# Patient Record
Sex: Female | Born: 1955
Health system: Southern US, Community
[De-identification: ages and names within clinical notes are randomized; demographics above are authoritative.]

## PROBLEM LIST (undated history)

## (undated) DIAGNOSIS — F329 Major depressive disorder, single episode, unspecified: Secondary | ICD-10-CM

## (undated) DIAGNOSIS — I2510 Atherosclerotic heart disease of native coronary artery without angina pectoris: Secondary | ICD-10-CM

## (undated) DIAGNOSIS — Z8249 Family history of ischemic heart disease and other diseases of the circulatory system: Secondary | ICD-10-CM

## (undated) DIAGNOSIS — F32A Depression, unspecified: Secondary | ICD-10-CM

## (undated) HISTORY — DX: Family history of ischemic heart disease and other diseases of the circulatory system: Z82.49

## (undated) HISTORY — PX: CHOLECYSTECTOMY: SHX55

## (undated) HISTORY — PX: MOUTH SURGERY: SHX715

## (undated) HISTORY — DX: Atherosclerotic heart disease of native coronary artery without angina pectoris: I25.10

---

## 2001-02-21 ENCOUNTER — Emergency Department (HOSPITAL_COMMUNITY): Admission: EM | Admit: 2001-02-21 | Discharge: 2001-02-22 | Payer: Self-pay | Admitting: Emergency Medicine

## 2001-02-21 ENCOUNTER — Encounter: Payer: Self-pay | Admitting: Emergency Medicine

## 2001-04-13 ENCOUNTER — Encounter (INDEPENDENT_AMBULATORY_CARE_PROVIDER_SITE_OTHER): Payer: Self-pay | Admitting: Specialist

## 2001-04-13 ENCOUNTER — Observation Stay (HOSPITAL_COMMUNITY): Admission: RE | Admit: 2001-04-13 | Discharge: 2001-04-14 | Payer: Self-pay | Admitting: General Surgery

## 2001-04-13 ENCOUNTER — Encounter: Payer: Self-pay | Admitting: General Surgery

## 2005-01-03 ENCOUNTER — Ambulatory Visit: Payer: Self-pay | Admitting: Internal Medicine

## 2008-03-16 ENCOUNTER — Emergency Department (HOSPITAL_COMMUNITY): Admission: EM | Admit: 2008-03-16 | Discharge: 2008-03-16 | Payer: Self-pay | Admitting: Family Medicine

## 2009-07-17 ENCOUNTER — Emergency Department (HOSPITAL_BASED_OUTPATIENT_CLINIC_OR_DEPARTMENT_OTHER): Admission: EM | Admit: 2009-07-17 | Discharge: 2009-07-17 | Payer: Self-pay | Admitting: Emergency Medicine

## 2009-07-21 ENCOUNTER — Ambulatory Visit: Payer: Self-pay | Admitting: Diagnostic Radiology

## 2009-07-21 ENCOUNTER — Emergency Department (HOSPITAL_BASED_OUTPATIENT_CLINIC_OR_DEPARTMENT_OTHER): Admission: EM | Admit: 2009-07-21 | Discharge: 2009-07-22 | Payer: Self-pay | Admitting: Emergency Medicine

## 2009-07-22 ENCOUNTER — Ambulatory Visit: Payer: Self-pay | Admitting: Interventional Radiology

## 2009-08-04 ENCOUNTER — Ambulatory Visit: Payer: Self-pay

## 2009-08-04 ENCOUNTER — Ambulatory Visit: Payer: Self-pay | Admitting: Cardiology

## 2009-08-04 ENCOUNTER — Encounter (INDEPENDENT_AMBULATORY_CARE_PROVIDER_SITE_OTHER): Payer: Self-pay | Admitting: Neurology

## 2009-08-04 ENCOUNTER — Ambulatory Visit (HOSPITAL_COMMUNITY): Admission: RE | Admit: 2009-08-04 | Discharge: 2009-08-04 | Payer: Self-pay | Admitting: Neurology

## 2011-01-06 LAB — COMPREHENSIVE METABOLIC PANEL
ALT: 17 U/L (ref 0–35)
AST: 21 U/L (ref 0–37)
Alkaline Phosphatase: 68 U/L (ref 39–117)
CO2: 26 mEq/L (ref 19–32)
Chloride: 106 mEq/L (ref 96–112)
Creatinine, Ser: 0.7 mg/dL (ref 0.4–1.2)
GFR calc Af Amer: >60 mL/min (ref >60)
GFR calc non Af Amer: >60 mL/min (ref >60)
Total Bilirubin: 0.5 mg/dL (ref 0.3–1.2)

## 2011-01-06 LAB — CBC
HCT: 45.6 % (ref 36.0–46.0)
Hemoglobin: 15.5 g/dL — ABNORMAL HIGH (ref 12.0–15.0)
MCHC: 34 g/dL (ref 30.0–36.0)
MCV: 91.3 fL (ref 78.0–100.0)
Platelets: 282 10*3/uL (ref 150–400)
RBC: 4.99 MIL/uL (ref 3.87–5.11)
RDW: 12.1 % (ref 11.5–15.5)
WBC: 10.1 10*3/uL (ref 4.0–10.5)

## 2011-01-06 LAB — URINALYSIS, ROUTINE W REFLEX MICROSCOPIC
Ketones, ur: NEGATIVE mg/dL
Nitrite: NEGATIVE
Protein, ur: NEGATIVE mg/dL
Urobilinogen, UA: 0.2 mg/dL (ref 0.0–1.0)

## 2011-01-06 LAB — DIFFERENTIAL
Basophils Absolute: 0.1 10*3/uL (ref 0.0–0.1)
Basophils Relative: 1 % (ref 0–1)
Eosinophils Absolute: 0 10*3/uL (ref 0.0–0.7)
Eosinophils Relative: 0 % (ref 0–5)
Lymphocytes Relative: 15 % (ref 12–46)

## 2011-02-18 NOTE — Op Note (Signed)
[facility]  Patient:    Connie Shaw, Connie Shaw                         MRN: [id number] Proc. Date: 04/13/01 Adm. Date:  [id number] Attending:  Henrene Dodge                           Operative Report  PREOPERATIVE DIAGNOSIS:  Chronic cholecystitis.  POSTOPERATIVE DIAGNOSIS;  Chronic cholecystitis.  OPERATION:  Laparoscopic cholecystectomy with cholangiogram.  ANESTHESIA:  General.  SURGEON:  Anselm Pancoast. Zachery Dakins, M.D.  ASSISTANTRiley Lam A. Magnus Ivan, M.D.  HISTORY:  Connie Shaw is a 55 year old Caucasian female, who was referred to me for symptomatic gallstones.  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  She also was noted to have gallstones and was referred to me.  She has stopped smoking over the last several months because of this pulmonary situation and is doing satisfactory from that standpoint.  I recommended that we proceed on with a laparoscopic cholecystectomy with cholangiogram.  Her liver function studies were normal preoperatively.  DESCRIPTION OF PROCEDURE:  The patient was taken to the operative suite.  She was given 3 grams of Unasyn, PAS stockings in place.  Induction of general anesthesia via endotracheal tube.  The abdomen was prepped with Betadine surgical scrub and solution and draped in a sterile manner.  A small incision was made below the umbilicus.  The patient noted to have a little fascial defect at the umbilicus but not really a truly symptomatic hernia with only a preperitoneal fat coming up through this, and this was removed, and then the peritoneum underlying it was identified, picked up between two hemostats, and a small opening made into the peritoneal cavity.  The traction suture was placed and the Hasson cannula introduced.  The gallbladder was distended but not acutely  inflamed.  The upper 10 mm trocar was placed under direct vision after anesthetizing the fascia with Marcaine, and the two lateral 5 mm trocars were placed in the appropriate position by Dr. Magnus Ivan.  The gallbladder was retracted upward and outward, and it was noted that her common bile duct appeared to be slightly prominent with a very short cystic duct.  We dissected this out as well as the cystic artery and placed a clip on the gallbladder junction of the cystic duct and then made a little opening.  Her cystic duct is not tiny but is not real large either.  The catheter was easily slipped within it, and then a cholangiogram was obtained.  It was noted that her common bile duct is definitely slightly prominent, but we did not see any definite stone at the distal ampulla area, and the dye reflects up into the intrahepatic radicles nicely.  I then removed the catheters, triply clipped the cystic duct under direct vision, divided it, then clipped the cystic artery x 3, divided it between the distal clip and two clips, and then freed the gallbladder from its bed with the hook electrocautery.  Good hemostasis was obtained, and then we grasped the gallbladder and brought it up at the umbilicus.  The neck of the gallbladder was open, and we fished out numerous cholesterol stones before it was slipped through the fascia.  Some of the stones  were up to 1 cm in size, but many were quite small, and I would not be surprised if she has not had a common bile duct stone given this chest discomfort that was being evaluated when the congenital bleb of the wound was identified.  The patient tolerated the procedure nicely and was extubated and taken to the recovery room in a stable postoperative condition.  She should be ready for discharge in the a.m., and the fascia closed at the umbilicus with two figure-of-eights of 0 Vicryl and then the subcuticular wounds closed with 4-0 Vicryl and Benzoin and  Steri-Strips on the skin, and the patient was extubated and taken to the recovery room in a satisfactory postoperative condition. DD:  04/13/01 TD:  04/13/01 Job: [id number]

## 2012-07-07 ENCOUNTER — Ambulatory Visit (INDEPENDENT_AMBULATORY_CARE_PROVIDER_SITE_OTHER): Payer: 59 | Admitting: Family Medicine

## 2012-07-07 VITALS — BP 126/78 | HR 75 | Temp 98.6°F | Resp 16 | Ht 65.5 in | Wt 174.2 lb

## 2012-07-07 DIAGNOSIS — J209 Acute bronchitis, unspecified: Secondary | ICD-10-CM

## 2012-07-07 DIAGNOSIS — J40 Bronchitis, not specified as acute or chronic: Secondary | ICD-10-CM

## 2012-07-07 MED ORDER — ALBUTEROL SULFATE HFA 108 (90 BASE) MCG/ACT IN AERS: 2.0000 | INHALATION_SPRAY | Freq: Four times a day (QID) | RESPIRATORY_TRACT | Status: DC | PRN

## 2012-07-07 MED ORDER — AZITHROMYCIN 250 MG PO TABS: ORAL_TABLET | ORAL | Status: DC

## 2012-07-07 MED ORDER — PREDNISONE 20 MG PO TABS: ORAL_TABLET | ORAL | Status: DC

## 2012-07-07 NOTE — Progress Notes (Signed)
@  [email or web address]@   Patient ID: Connie Shaw MRN: [id number], DOB: [birth year], 56 y.o. Date of Encounter: 07/07/2012, 2:20 PM  Primary Physician: No primary provider on file.  Chief Complaint:  Chief Complaint  Patient presents with  . Cough    x 2 months non productive   . Nasal Congestion    x  2 months chest congestion   . Shortness of Breath    x 1 week     HPI: 56 y.o. year old female presents with a 9 day history of nasal congestion, post nasal drip, sore throat, and cough. Mild sinus pressure. Afebrile. No chills. Nasal congestion thick and green/yellow. Cough is productive of green/yellow sputum and not associated with time of day. Ears feel full, leading to sensation of muffled hearing. Has tried OTC cold preps without success. No GI complaints.  No sick contacts, recent antibiotics, or recent travels.   No leg trauma, sedentary periods, h/o cancer, or tobacco use.  No past medical history on file.   Home Meds: Prior to Admission medications   Not on File    Allergies:  Allergies  Allergen Reactions  . Codeine Itching    History   Social History  . Marital Status: Married    Spouse Name: N/A    Number of Children: N/A  . Years of Education: N/A   Occupational History  . Not on file.   Social History Main Topics  . Smoking status: Current Every Day Smoker  . Smokeless tobacco: Not on file  . Alcohol Use: Not on file  . Drug Use: Not on file  . Sexually Active: Not on file   Other Topics Concern  . Not on file   Social History Narrative  . No narrative on file     Review of Systems: Constitutional: negative for chills, fever, night sweats or weight changes Cardiovascular: negative for chest pain or palpitations Respiratory: negative for hemoptysis, wheezing, or shortness of breath Abdominal: negative for abdominal pain, nausea, vomiting or diarrhea Dermatological: negative for rash Neurologic: negative for headache   Physical Exam: Blood  pressure 126/78, pulse 75, temperature 98.6 F (37 C), temperature source Oral, resp. rate 16, height 5' 5.5" (1.664 m), weight 174 lb 3.2 oz (79.017 kg), SpO2 97.00%., Body mass index is 28.55 kg/(m^2). General: Well developed, well nourished, in no acute distress. Head: Normocephalic, atraumatic, eyes without discharge, sclera non-icteric, nares are congested. Bilateral auditory canals clear, TM's are without perforation, pearly grey with reflective cone of light bilaterally. No sinus TTP. Oral cavity moist, dentition normal. Posterior pharynx with post nasal drip and mild erythema. No peritonsillar abscess or tonsillar exudate. Neck: Supple. No thyromegaly. Full ROM. No lymphadenopathy. Lungs: Coarse breath sounds bilaterally with wheezes, rales, and rhonchi. Breathing is unlabored. Horribly congested cough Heart: RRR with S1 S2. No murmurs, rubs, or gallops appreciated. Msk:  Strength and tone normal for age. Extremities: No clubbing or cyanosis. No edema. Neuro: Alert and oriented X 3. Moves all extremities spontaneously. CNII-XII grossly in tact. Psych:  Responds to questions appropriately with a normal affect.    ASSESSMENT AND PLAN:  56 y.o. year old female with bronchitis. 1. Bronchitis  azithromycin (ZITHROMAX Z-PAK) 250 MG tablet, predniSONE (DELTASONE) 20 MG tablet, albuterol (PROVENTIL HFA;VENTOLIN HFA) 108 (90 BASE) MCG/ACT inhaler   Call if no better 24 hours. -try to quit smokes -Mucinex -Tylenol/Motrin prn -Rest/fluids -RTC precautions -RTC 3-5 days if no improvement  Signed, Elvina Sidle, MD 07/07/2012 2:20 PM

## 2012-07-07 NOTE — Patient Instructions (Signed)
Sinusitis Sinusitis is redness, soreness, and swelling (inflammation) of the paranasal sinuses. Paranasal sinuses are air pockets within the bones of your face (beneath the eyes, the middle of the forehead, or above the eyes). In healthy paranasal sinuses, mucus is able to drain out, and air is able to circulate through them by way of your nose. However, when your paranasal sinuses are inflamed, mucus and air can become trapped. This can allow bacteria and other germs to grow and cause infection. Sinusitis can develop quickly and last only a short time (acute) or continue over a long period (chronic). Sinusitis that lasts for more than 12 weeks is considered chronic.  CAUSES  Causes of sinusitis include:  Allergies.  Structural abnormalities, such as displacement of the cartilage that separates your nostrils (deviated septum), which can decrease the air flow through your nose and sinuses and affect sinus drainage.  Functional abnormalities, such as when the small hairs (cilia) that line your sinuses and help remove mucus do not work properly or are not present. SYMPTOMS  Symptoms of acute and chronic sinusitis are the same. The primary symptoms are pain and pressure around the affected sinuses. Other symptoms include:  Upper toothache.  Earache.  Headache.  Bad breath.  Decreased sense of smell and taste.  A cough, which worsens when you are lying flat.  Fatigue.  Fever.  Thick drainage from your nose, which often is green and may contain pus (purulent).  Swelling and warmth over the affected sinuses. DIAGNOSIS  Your caregiver will perform a physical exam. During the exam, your caregiver may:  Look in your nose for signs of abnormal growths in your nostrils (nasal polyps).  Tap over the affected sinus to check for signs of infection.  View the inside of your sinuses (endoscopy) with a special imaging device with a light attached (endoscope), which is inserted into your  sinuses. If your caregiver suspects that you have chronic sinusitis, one or more of the following tests may be recommended:  Allergy tests.  Nasal culture A sample of mucus is taken from your nose and sent to a lab and screened for bacteria.  Nasal cytology A sample of mucus is taken from your nose and examined by your caregiver to determine if your sinusitis is related to an allergy. TREATMENT  Most cases of acute sinusitis are related to a viral infection and will resolve on their own within 10 days. Sometimes medicines are prescribed to help relieve symptoms (pain medicine, decongestants, nasal steroid sprays, or saline sprays).  However, for sinusitis related to a bacterial infection, your caregiver will prescribe antibiotic medicines. These are medicines that will help kill the bacteria causing the infection.  Rarely, sinusitis is caused by a fungal infection. In theses cases, your caregiver will prescribe antifungal medicine. For some cases of chronic sinusitis, surgery is needed. Generally, these are cases in which sinusitis recurs more than 3 times per year, despite other treatments. HOME CARE INSTRUCTIONS   Drink plenty of water. Water helps thin the mucus so your sinuses can drain more easily.  Use a humidifier.  Inhale steam 3 to 4 times a day (for example, sit in the bathroom with the shower running).  Apply a warm, moist washcloth to your face 3 to 4 times a day, or as directed by your caregiver.  Use saline nasal sprays to help moisten and clean your sinuses.  Take over-the-counter or prescription medicines for pain, discomfort, or fever only as directed by your caregiver. SEEK IMMEDIATE MEDICAL   CARE IF:  You have increasing pain or severe headaches.  You have nausea, vomiting, or drowsiness.  You have swelling around your face.  You have vision problems.  You have a stiff neck.  You have difficulty breathing. MAKE SURE YOU:   Understand these  instructions.  Will watch your condition.  Will get help right away if you are not doing well or get worse. Document Released: 09/19/2005 Document Revised: 12/12/2011 Document Reviewed: 10/04/2011 Exit[employer] Patient Information 2013 [employer]. Bronchitis Bronchitis is the body's way of reacting to injury and/or infection (inflammation) of the bronchi. Bronchi are the air tubes that extend from the windpipe into the lungs. If the inflammation becomes severe, it may cause shortness of breath. CAUSES  Inflammation may be caused by:  A virus.  Germs (bacteria).  Dust.  Allergens.  Pollutants and many other irritants. The cells lining the bronchial tree are covered with tiny hairs (cilia). These constantly beat upward, away from the lungs, toward the mouth. This keeps the lungs free of pollutants. When these cells become too irritated and are unable to do their job, mucus begins to develop. This causes the characteristic cough of bronchitis. The cough clears the lungs when the cilia are unable to do their job. Without either of these protective mechanisms, the mucus would settle in the lungs. Then you would develop pneumonia. Smoking is a common cause of bronchitis and can contribute to pneumonia. Stopping this habit is the single most important thing you can do to help yourself. TREATMENT   Your caregiver may prescribe an antibiotic if the cough is caused by bacteria. Also, medicines that open up your airways make it easier to breathe. Your caregiver may also recommend or prescribe an expectorant. It will loosen the mucus to be coughed up. Only take over-the-counter or prescription medicines for pain, discomfort, or fever as directed by your caregiver.  Removing whatever causes the problem (smoking, for example) is critical to preventing the problem from getting worse.  Cough suppressants may be prescribed for relief of cough symptoms.  Inhaled medicines may be prescribed to help with  symptoms now and to help prevent problems from returning.  For those with recurrent (chronic) bronchitis, there may be a need for steroid medicines. SEEK IMMEDIATE MEDICAL CARE IF:   During treatment, you develop more pus-like mucus (purulent sputum).  You have a fever.  Your baby is older than 3 months with a rectal temperature of 102 F (38.9 C) or higher.  Your baby is 3 months old or younger with a rectal temperature of 100.4 F (38 C) or higher.  You become progressively more ill.  You have increased difficulty breathing, wheezing, or shortness of breath. It is necessary to seek immediate medical care if you are elderly or sick from any other disease. MAKE SURE YOU:   Understand these instructions.  Will watch your condition.  Will get help right away if you are not doing well or get worse. Document Released: 09/19/2005 Document Revised: 12/12/2011 Document Reviewed: 07/29/2008 Exit[employer] Patient Information 2013 [employer].  

## 2012-12-07 ENCOUNTER — Encounter (HOSPITAL_BASED_OUTPATIENT_CLINIC_OR_DEPARTMENT_OTHER): Payer: Self-pay | Admitting: *Deleted

## 2012-12-07 ENCOUNTER — Emergency Department (HOSPITAL_BASED_OUTPATIENT_CLINIC_OR_DEPARTMENT_OTHER)
Admission: EM | Admit: 2012-12-07 | Discharge: 2012-12-07 | Disposition: A | Discharge status: Home / Self Care | Payer: 59 | Attending: Emergency Medicine | Admitting: Emergency Medicine

## 2012-12-07 DIAGNOSIS — R209 Unspecified disturbances of skin sensation: Secondary | ICD-10-CM | POA: Insufficient documentation

## 2012-12-07 DIAGNOSIS — Z79899 Other long term (current) drug therapy: Secondary | ICD-10-CM | POA: Insufficient documentation

## 2012-12-07 DIAGNOSIS — F17200 Nicotine dependence, unspecified, uncomplicated: Secondary | ICD-10-CM | POA: Insufficient documentation

## 2012-12-07 DIAGNOSIS — R059 Cough, unspecified: Secondary | ICD-10-CM | POA: Insufficient documentation

## 2012-12-07 DIAGNOSIS — J329 Chronic sinusitis, unspecified: Secondary | ICD-10-CM

## 2012-12-07 DIAGNOSIS — R202 Paresthesia of skin: Secondary | ICD-10-CM

## 2012-12-07 DIAGNOSIS — R05 Cough: Secondary | ICD-10-CM | POA: Insufficient documentation

## 2012-12-07 LAB — GLUCOSE, CAPILLARY

## 2012-12-07 MED ORDER — AMOXICILLIN-POT CLAVULANATE 875-125 MG PO TABS: 1.0000 | ORAL_TABLET | Freq: Two times a day (BID) | ORAL | Status: DC

## 2012-12-07 NOTE — ED Notes (Signed)
**Note De-Identified Connie Shaw Obfuscation** Took patient's blood sugar, result was: 83. Nurse was notified.

## 2012-12-07 NOTE — ED Provider Notes (Signed)
History     CSN: [id number]  Arrival date & time 12/07/12  2028   First MD Initiated Contact with Patient 12/07/12 2039      Chief Complaint  Patient presents with  . Numbness    (Consider location/radiation/quality/duration/timing/severity/associated sxs/prior treatment) HPI Comments: Patient presents with numbness to her right upper lip. She states that she was sitting on the bed talking with her daughter and had onset of numbness to the right upper lip. She denies any other facial numbness. She denies any facial drooping. She denies any speech difficulties. She denies any vision changes. She has a history of vertigo but denies any dizziness other than her baseline vertigo symptoms. She denies any headache. She denies any numbness or weakness in her extremities. She denies any balance difficulties other than her chronic issues which is associated with her vertigo. She denies any past history is of strokes, diabetes or hypertension. She states that the numbness is almost completely resolved at this point. She states that she did have major dental surgery in this area. She also states she's had about a one and a half to two-week history of sinus congestion which has been worsening over last few days. She has a nonproductive cough. She denies he fevers or chills. She states in the past when she was seen for vertigo she did have an MRI at one point which showed a questionable prior ischemic event in her cerebellar region. However she followed up with a neurologist and had an MRI and MRA and was told that she had never had any TIAs or ischemic events in the studies were normal.   History reviewed. No pertinent past medical history.  Past Surgical History  Procedure Laterality Date  . Mouth surgery    . Cholecystectomy      No family history on file.  History  Substance Use Topics  . Smoking status: Current Every Day Smoker  . Smokeless tobacco: Not on file  . Alcohol Use: No    OB  History   Grav Para Term Preterm Abortions TAB SAB Ect Mult Living                  Review of Systems  Constitutional: Negative for fever, chills, diaphoresis and fatigue.  HENT: Negative for congestion, rhinorrhea and sneezing.   Eyes: Negative.   Respiratory: Negative for cough, chest tightness and shortness of breath.   Cardiovascular: Negative for chest pain and leg swelling.  Gastrointestinal: Negative for nausea, vomiting, abdominal pain, diarrhea and blood in stool.  Genitourinary: Negative for frequency, hematuria, flank pain and difficulty urinating.  Musculoskeletal: Negative for back pain and arthralgias.  Skin: Negative for rash.  Neurological: Positive for numbness. Negative for dizziness, speech difficulty, weakness, light-headedness and headaches.    Allergies  Codeine  Home Medications   Current Outpatient Rx  Name  Route  Sig  Dispense  Refill  . ALPRAZolam (XANAX) 0.25 MG tablet   Oral   Take by mouth at bedtime as needed for sleep.         Marland Kitchen albuterol (PROVENTIL HFA;VENTOLIN HFA) 108 (90 BASE) MCG/ACT inhaler   Inhalation   Inhale 2 puffs into the lungs every 6 (six) hours as needed for wheezing.   1 Inhaler   0   . amoxicillin-clavulanate (AUGMENTIN) 875-125 MG per tablet   Oral   Take 1 tablet by mouth 2 (two) times daily.   20 tablet   0   . azithromycin (ZITHROMAX Z-PAK) 250 MG tablet  Take as directed on pack   6 tablet   0   . predniSONE (DELTASONE) 20 MG tablet      2 daily with food   10 tablet   1     BP 127/87  Pulse 95  Temp(Src) 98.4 F (36.9 C) (Oral)  Resp 18  SpO2 95%  Physical Exam  Constitutional: She is oriented to person, place, and time. She appears well-developed and well-nourished.  HENT:  Head: Normocephalic and atraumatic.  Eyes: Pupils are equal, round, and reactive to light.  Neck: Normal range of motion. Neck supple.  Cardiovascular: Normal rate, regular rhythm and normal heart sounds.    Pulmonary/Chest: Effort normal and breath sounds normal. No respiratory distress. She has no wheezes. She has no rales. She exhibits no tenderness.  Abdominal: Soft. Bowel sounds are normal. There is no tenderness. There is no rebound and no guarding.  Musculoskeletal: Normal range of motion. She exhibits no edema.  Lymphadenopathy:    She has no cervical adenopathy.  Neurological: She is alert and oriented to person, place, and time. She has normal strength. No cranial nerve deficit or sensory deficit. GCS eye subscore is 4. GCS verbal subscore is 5. GCS motor subscore is 6.  Finger to nose intact. Gait normal  Skin: Skin is warm and dry. No rash noted.  Psychiatric: She has a normal mood and affect.    ED Course  Procedures (including critical care time)  Labs Reviewed - No data to display No results found.   1. Paresthesia   2. Sinusitis       MDM  Patient with localized numbness to her right upper lip. She has no associated facial drooping or other stroke symptoms. I have a Low suspicion that this would be from a TIA/CVA. I feel that this is likely a localized paresthesia, possibly from sinusitis or her past dental surgery. She has no other symptoms that would be suspicious of a TIA. I did advise her to monitor this over the weekend if she has any other associated symptoms that she needs to return here for repeat evaluation. I advised her followup with her primary care physician on Monday. I did give her a prescription for Augmentin for her worsening sinusitis.        Rolan Bucco, MD 12/07/12 2115

## 2012-12-07 NOTE — ED Notes (Signed)
Pt reports that about 1 hour ago that she had onset of a feeling of numbness just above her right lip that lasted about 20 minutes. She said it "felt like I was injected with novacaine."

## 2012-12-07 NOTE — ED Notes (Signed)
MD at bedside. 

## 2014-09-01 ENCOUNTER — Inpatient Hospital Stay (HOSPITAL_COMMUNITY)
Admission: RE | Admit: 2014-09-01 | Discharge: 2014-09-03 | DRG: 885 | Disposition: A | Discharge status: Home / Self Care | Payer: 59 | Attending: Psychiatry | Admitting: Psychiatry

## 2014-09-01 ENCOUNTER — Encounter (HOSPITAL_COMMUNITY): Payer: Self-pay | Admitting: Behavioral Health

## 2014-09-01 DIAGNOSIS — R45851 Suicidal ideations: Secondary | ICD-10-CM | POA: Diagnosis present

## 2014-09-01 DIAGNOSIS — G4700 Insomnia, unspecified: Secondary | ICD-10-CM | POA: Diagnosis present

## 2014-09-01 DIAGNOSIS — Z609 Problem related to social environment, unspecified: Secondary | ICD-10-CM | POA: Diagnosis present

## 2014-09-01 DIAGNOSIS — I10 Essential (primary) hypertension: Secondary | ICD-10-CM | POA: Diagnosis present

## 2014-09-01 DIAGNOSIS — F17200 Nicotine dependence, unspecified, uncomplicated: Secondary | ICD-10-CM | POA: Diagnosis present

## 2014-09-01 DIAGNOSIS — F419 Anxiety disorder, unspecified: Secondary | ICD-10-CM | POA: Diagnosis present

## 2014-09-01 DIAGNOSIS — F332 Major depressive disorder, recurrent severe without psychotic features: Secondary | ICD-10-CM | POA: Diagnosis present

## 2014-09-01 HISTORY — DX: Major depressive disorder, single episode, unspecified: F32.9

## 2014-09-01 HISTORY — DX: Depression, unspecified: F32.A

## 2014-09-01 MED ORDER — MAGNESIUM HYDROXIDE 400 MG/5ML PO SUSP: 30.0000 mL | Freq: Every day | ORAL | Status: DC | PRN

## 2014-09-01 MED ORDER — ALBUTEROL SULFATE (2.5 MG/3ML) 0.083% IN NEBU: 3.0000 mL | INHALATION_SOLUTION | Freq: Four times a day (QID) | RESPIRATORY_TRACT | Status: DC | PRN

## 2014-09-01 MED ORDER — HYDROXYZINE HCL 50 MG PO TABS
50.0000 mg | ORAL_TABLET | Freq: Every evening | ORAL | Status: DC | PRN
  Filled 2014-09-01 (×5): qty 1

## 2014-09-01 MED ORDER — NICOTINE 21 MG/24HR TD PT24
21.0000 mg | MEDICATED_PATCH | Freq: Every day | TRANSDERMAL | Status: DC
  Administered 2014-09-01 – 2014-09-03 (×2): 21 mg via TRANSDERMAL
  Filled 2014-09-01 (×5): qty 1

## 2014-09-01 MED ORDER — ACETAMINOPHEN 325 MG PO TABS: 650.0000 mg | ORAL_TABLET | Freq: Four times a day (QID) | ORAL | Status: DC | PRN

## 2014-09-01 MED ORDER — ALUM & MAG HYDROXIDE-SIMETH 200-200-20 MG/5ML PO SUSP: 30.0000 mL | ORAL | Status: DC | PRN

## 2014-09-01 NOTE — Progress Notes (Signed)
Admission Note  D: Patient admitted to [facility] as a walk-in patient. She reported that she's been struggling with depression since age 58, but it has worsened over the past 6 or 7 months now. Patient voiced that her current stressors are her daughter's health, work and finances.  A: Support and encouragement provided to patient. Oriented patient to the unit and informed her of the rules/policies of the hospital. Initiated Q15 minute checks for safety.  R: Patient receptive. Endorses passive SI, but contracts for safety. Denies HI and AVH. Patient remains safe on the unit.
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
## 2014-09-01 NOTE — Progress Notes (Signed)
Adult Psychoeducational Group Note  Date:  09/01/2014 Time:  9:32 PM  Group Topic/Focus:  Wrap-Up Group:   The focus of this group is to help patients review their daily goal of treatment and discuss progress on daily workbooks.  Participation Level:  Active  Participation Quality:  Appropriate  Affect:  Appropriate  Cognitive:  Appropriate  Insight: Good  Engagement in Group:  Engaged  Modes of Intervention:  Discussion  Additional Comments:  Pt stated she has been dealing with expression since age 66 and she has finally found a way to cope with her depression.  Quentin Angst 09/01/2014, 9:32 PM

## 2014-09-01 NOTE — Tx Team (Signed)
Initial Interdisciplinary Treatment Plan   PATIENT STRESSORS: Financial difficulties Occupational concerns   PATIENT STRENGTHS: Ability for insight Capable of independent living Motivation for treatment/growth   PROBLEM LIST: Problem List/Patient Goals Date to be addressed Date deferred Reason deferred Estimated date of resolution  Depression 09/01/14     Anxiety 09/01/14                                                DISCHARGE CRITERIA:  Ability to meet basic life and health needs Improved stabilization in mood, thinking, and/or behavior Motivation to continue treatment in a less acute level of care  PRELIMINARY DISCHARGE PLAN: Attend PHP/IOP Outpatient therapy Return to previous work or school arrangements  PATIENT/FAMIILY INVOLVEMENT: This treatment plan has been presented to and reviewed with the patient, Ralyn A Lipschutz.  The patient and family have been given the opportunity to ask questions and make suggestions.  Kathlen Brunswick 09/01/2014, 6:38 PM

## 2014-09-02 ENCOUNTER — Ambulatory Visit: Payer: 59 | Admitting: Podiatry

## 2014-09-02 DIAGNOSIS — F332 Major depressive disorder, recurrent severe without psychotic features: Principal | ICD-10-CM

## 2014-09-02 LAB — CBC
HEMATOCRIT: 43.6 % (ref 36.0–46.0)
HEMOGLOBIN: 14.8 g/dL (ref 12.0–15.0)
MCH: 30 pg (ref 26.0–34.0)
MCHC: 33.9 g/dL (ref 30.0–36.0)
MCV: 88.4 fL (ref 78.0–100.0)
Platelets: 295 10*3/uL (ref 150–400)
RBC: 4.93 MIL/uL (ref 3.87–5.11)
RDW: 12.4 % (ref 11.5–15.5)
WBC: 6.3 10*3/uL (ref 4.0–10.5)

## 2014-09-02 LAB — URINALYSIS, ROUTINE W REFLEX MICROSCOPIC
BILIRUBIN URINE: NEGATIVE
Glucose, UA: NEGATIVE mg/dL
HGB URINE DIPSTICK: NEGATIVE
KETONES UR: NEGATIVE mg/dL
Leukocytes, UA: NEGATIVE
Nitrite: NEGATIVE
Protein, ur: NEGATIVE mg/dL
SPECIFIC GRAVITY, URINE: 1.012 (ref 1.005–1.030)
UROBILINOGEN UA: 0.2 mg/dL (ref 0.0–1.0)
pH: 7 (ref 5.0–8.0)

## 2014-09-02 LAB — COMPREHENSIVE METABOLIC PANEL
ALT: 11 U/L (ref 0–35)
ANION GAP: 10 (ref 5–15)
AST: 14 U/L (ref 0–37)
Albumin: 3.9 g/dL (ref 3.5–5.2)
Alkaline Phosphatase: 65 U/L (ref 39–117)
BUN: 10 mg/dL (ref 6–23)
CALCIUM: 9.5 mg/dL (ref 8.4–10.5)
CO2: 28 mEq/L (ref 19–32)
Chloride: 104 mEq/L (ref 96–112)
Creatinine, Ser: 0.79 mg/dL (ref 0.50–1.10)
GFR calc non Af Amer: 89 mL/min — ABNORMAL LOW (ref >90)
GLUCOSE: 89 mg/dL (ref 70–99)
Potassium: 3.9 mEq/L (ref 3.7–5.3)
SODIUM: 142 meq/L (ref 137–147)
TOTAL PROTEIN: 7.1 g/dL (ref 6.0–8.3)
Total Bilirubin: 0.3 mg/dL (ref 0.3–1.2)

## 2014-09-02 LAB — TSH: TSH: 1.84 u[IU]/mL (ref 0.350–4.500)

## 2014-09-02 MED ORDER — LISINOPRIL 20 MG PO TABS
20.0000 mg | ORAL_TABLET | Freq: Once | ORAL | Status: AC
  Administered 2014-09-02: 20 mg via ORAL
  Filled 2014-09-02 (×2): qty 1

## 2014-09-02 MED ORDER — LISINOPRIL 10 MG PO TABS
10.0000 mg | ORAL_TABLET | Freq: Every day | ORAL | Status: DC
  Administered 2014-09-03: 10 mg via ORAL
  Filled 2014-09-02 (×3): qty 1

## 2014-09-02 MED ORDER — HYDROXYZINE HCL 25 MG PO TABS
25.0000 mg | ORAL_TABLET | Freq: Four times a day (QID) | ORAL | Status: DC | PRN
  Administered 2014-09-02 – 2014-09-03 (×3): 25 mg via ORAL
  Filled 2014-09-02: qty 10
  Filled 2014-09-02 (×3): qty 1

## 2014-09-02 NOTE — [facility] Suicide Risk Assessment (Addendum)
   Nursing information obtained from:    Demographic factors:   58 year old female, lives with disabled daughter, employed  Current Mental Status:   See below Loss Factors:   daughter has a history of disability and opiate dependence / stressful job  Historical Factors:   Chronic Depression Risk Reduction Factors:   resilience, sense of responsibility to family Total Time spent with patient: 45 minutes  CLINICAL FACTORS:  Chronic depression, worsening in the context of her stressors   Psychiatric Specialty Exam: Physical Exam  ROS  Blood pressure 131/99, pulse 67, temperature 98.2 F (36.8 C), temperature source Oral, resp. rate 20, height 5\' 6"  (1.676 m), weight 73.483 kg (162 lb).Body mass index is 26.16 kg/(m^2).  General Appearance: Well Groomed  Engineer, water::  Good  Speech:  Normal Rate  Volume:  Normal  Mood:  Depressed- but states she is feeling better than yesterday  Affect:  Constricted and but reactive, and does smile at times appropriately  Thought Process:  Goal Directed and Linear  Orientation:  Full (Time, Place, and Person)  Thought Content:  no hallucinations, no delusions  Suicidal Thoughts:  No At this time denies any suicidal plan or intent and contracts for safety on the unit  Homicidal Thoughts:  No  Memory:  Recent and Remote grossly intact  Judgement:  Fair  Insight:  Good  Psychomotor Activity:  Normal  Concentration:  Good  Recall:  Good  Fund of Knowledge:Good  Language: Good  Akathisia:  Negative  Handed:  Right  AIMS (if indicated):     Assets:  Communication Skills Desire for Improvement Resilience  Sleep:  Number of Hours: 6.5   Musculoskeletal: Strength & Muscle Tone: within normal limits Gait & Station: normal Patient leans: N/A  COGNITIVE FEATURES THAT CONTRIBUTE TO RISK:  Closed-mindedness    SUICIDE RISK:   Moderate:  Frequent suicidal ideation with limited intensity, and duration, some specificity in terms of plans, no  associated intent, good self-control, limited dysphoria/symptomatology, some risk factors present, and identifiable protective factors, including available and accessible social support.  PLAN OF CARE: Patient will be admitted to inpatient psychiatric unit for stabilization and safety. Will provide and encourage milieu participation. Provide medication management and maked adjustments as needed.  Will follow daily.    I certify that inpatient services furnished can reasonably be expected to improve the patient's condition.  Maren Wiesen 09/02/2014, 12:51 PM

## 2014-09-02 NOTE — [facility] Group Notes (Signed)
[place] Group Notes:  (Nursing/MHT/Case Management/Adjunct)  Date:  09/02/2014  Time:  08:45  Type of Therapy:  Nurse Education  Participation Level:  Active  Participation Quality:  Appropriate and Sharing  Affect:  Appropriate  Cognitive:  Appropriate and Oriented  Insight:  Good  Engagement in Group:  Engaged  Modes of Intervention:  Discussion, Education and Orientation  Summary of Progress/Problems: Pt goal for recovery is to find some happiness and therefore have more energy to do day to day activities.   Connie Shaw 09/02/2014, 2:34 PM

## 2014-09-02 NOTE — Progress Notes (Signed)
Adult Psychoeducational Group Note  Date:  09/02/2014 Time:  10:50 PM  Group Topic/Focus:  Wrap-Up Group:   The focus of this group is to help patients review their daily goal of treatment and discuss progress on daily workbooks.  Participation Level:  Did Not Attend   Additional Comments: Pt did not attend wrap up group  Eryck Negron, [place] 09/02/2014, 10:50 PM

## 2014-09-02 NOTE — Progress Notes (Signed)
Patient ID: Connie Shaw, female   DOB: 1956/04/17, 58 y.o.   MRN: [id number] D: Patient alert and cooperative. Pt stated "this is not what I was expecting". Pt reports not feeling safe and wants to leave tomorrow. Pt reports feeling anxious. Pt denies SI/HI/AVH. No acute distressed noted at this time.   A: pt encourage to speak with provider tomorrow. Medication for anxiety offered to pt. 72 hour discharge explained to pt.  Emotional support given and will continue to monitor pt's safety.  R: Patient is safe. Pt refuse medications. Pt refuse to sign 72 hour discharge papers.

## 2014-09-02 NOTE — Progress Notes (Signed)
Patient ID: Connie Shaw, female   DOB: 23-Nov-1955, 58 y.o.   MRN: [id number]  D:  58 year old female presented to [facility] with thoughts of self harm. Pt reports that she has "had depression since the 1980's but that yesterday it got really bad." Pt reports that she is feeling better this morning and would like to speak with a psyciatrist this morning. Pt presents with an appropriate affect and depressed behavior. Per self inventory, pt rates depression at a 5, hopelessness 5 and anxiety 5. Pt's daily goal is to "go home." Pt reports fair sleep, good concentration, low energy and a fair appetite. Pt reports that she did not sleep last night. No other complaints noted.    A: Pt provided with medications per providers orders. Pt's labs and vitals were monitored throughout the day. Pt supported emotionally and encouraged to express concerns and questions. Pt consulted with provider and Probation officer. Pt educated on coping skills, new medications and heart healthy dietary changes.   R:Pt's safety ensured with 15 minute and environmental checks. Pt currently denies SI/HI and A/V hallucinations. Pt verbally agrees to seek staff if SI/HI or A/VH occurs and to consult with staff before acting on these thoughts. Pt is laughing with other pt's and socializing in the dayroom.   Elenore Rota, RN

## 2014-09-02 NOTE — Progress Notes (Signed)
Patient ID: Connie Shaw, female   DOB: January 01, 1956, 58 y.o.   MRN: [id number]   Pt reports that her Vistaril has been effective in controlling her anxiety. Pt states "this is making my anxiety better. I probably shouldn't be on xanax when I leave, it may have been the reason why I was so tired." Pt encouraged to share feelings with provider and social worker. Pt emotionally supported. Pt was able to speak with her daughter which made her "feel better" today as well. Pt still reports overwhelming stress and the need for further treatment.
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
## 2014-09-02 NOTE — [facility] Suicide Risk Assessment (Signed)
[place] INPATIENT:  Family/Significant Other Suicide Prevention Education  Suicide Prevention Education:  Patient Refusal for Family/Significant Other Suicide Prevention Education: The patient Connie Shaw has refused to provide written consent for family/significant other to be provided Family/Significant Other Suicide Prevention Education during admission and/or prior to discharge.  Physician notified.  Concha Pyo 09/02/2014, 3:26 PM

## 2014-09-02 NOTE — Plan of Care (Signed)
Problem: Alteration in mood Goal: LTG-Pt's behavior demonstrates decreased signs of depression Goal not met. Patient is rating depression at five. Goal is for patient to rate depression at four or below prior to discharge.  Burnis Medin Hodnett, LCSW 09/02/2014 1:08 PM     (Patient's behavior demonstrates decreased signs of depression to the point the patient is safe to return home and continue treatment in an outpatient setting)  Outcome: Progressing Pt is spending time in the dayroom and socializing with other pts.

## 2014-09-02 NOTE — H&P (Signed)
Psychiatric Admission Assessment Adult  Patient Identification:  Connie Shaw  Date of Evaluation:  09/02/2014  Chief Complaint:  MDD  History of Present Illness: Pang is a 58 year old Caucasian female, admitted to [facility] as a walk-in. She reports, "I was at work, a horrible work environment. I was doing most of the work while everybody else was chatting and carrying on on the telephone. That created a lot of anxiety for me. My head started to hurt real bad. I realized that I could no longer handle or cope with this, this work environment is not healthy or suitable for me. I called my psychiatrist Dr. Toy Care, she directed me to come to this hospital. I walked in. I'm very stressed at home too, taking care of my daughter. She is 23 years old, has a lot of chronic medical issues, series of back surgeries, always in pain. Now she is addicted to her narcotics. The thought of that is hurting me. I have been dealing with depression and bad anxiety x 5 years. But, right now, I feel all right. I can go home. I don't belong here. I was on depression medicine called Zybrid a while ago, given to me by Dr. Toy Care. It was working for me, but I stopped it with the belief that I was well. I need to get back on it".  Elements:  Location:  Major depression. Quality:  High anxiety levels, frustraion, fatigue. Severity:  Severe, "I was no longer able to deal with things and or cope. Timing:  Happened yesterday. Duration:  "I have had anxiety/depression x 5 years". Context:  "Was at work, working too hard, felt overwhelmed, frustated, bad headaches, went to the hospital".  Associated Signs/Synptoms:  Depression Symptoms:  depressed mood, feelings of worthlessness/guilt, anxiety, loss of energy/fatigue, disturbed sleep,  (Hypo) Manic Symptoms:  Denies  Anxiety Symptoms:  Excessive Worry, Social Anxiety,  Psychotic Symptoms:  Denies  PTSD Symptoms: NA  Total Time spent with patient: 1  hour  Psychiatric Specialty Exam: Physical Exam  Constitutional: She is oriented to person, place, and time. She appears well-developed.  HENT:  Head: Normocephalic.  Eyes: Pupils are equal, round, and reactive to light.  Neck: Normal range of motion.  Cardiovascular: Normal rate.   Respiratory: Effort normal.  GI: Soft.  Genitourinary:  Denies any issues in this area  Musculoskeletal: Normal range of motion.  Neurological: She is alert and oriented to person, place, and time.  Skin: Skin is warm and dry.  Psychiatric: Her speech is normal and behavior is normal. Judgment and thought content normal. Her mood appears anxious. Her affect is not angry, not blunt, not labile and not inappropriate. Cognition and memory are normal. She exhibits a depressed mood.    Review of Systems  Constitutional: Negative.   HENT: Negative.   Eyes: Negative.   Respiratory: Negative.   Cardiovascular: Negative.   Gastrointestinal: Negative.   Genitourinary: Negative.   Musculoskeletal: Negative.   Skin: Negative.   Neurological: Negative.   Endo/Heme/Allergies: Negative.   Psychiatric/Behavioral: Positive for depression and suicidal ideas. Negative for hallucinations, memory loss and substance abuse. The patient is nervous/anxious and has insomnia.     Blood pressure 136/91, pulse 70, temperature 98.2 F (36.8 C), temperature source Oral, resp. rate 20, height '5\' 6"'  (1.676 m), weight 73.483 kg (162 lb).Body mass index is 26.16 kg/(m^2).  General Appearance: Casual and Fairly Groomed  Eye Contact::  Good  Speech:  Clear and Coherent  Volume:  Normal  Mood:  Anxious and Depressed  Affect:  Congruent and Flat  Thought Process:  Coherent and Goal Directed  Orientation:  Full (Time, Place, and Person)  Thought Content:  Rumination  Suicidal Thoughts:  No  Homicidal Thoughts:  No  Memory:  Immediate;   Good Recent;   Good Remote;   Good  Judgement:  Fair  Insight:  Present  Psychomotor  Activity:  Normal  Concentration:  Fair  Recall:  Good  Fund of Knowledge:Fair  Language: Good  Akathisia:  No  Handed:  Right  AIMS (if indicated):     Assets:  Desire for Improvement  Sleep:  Number of Hours: 6.5   Musculoskeletal: Strength & Muscle Tone: within normal limits Gait & Station: normal Patient leans: N/A  Past Psychiatric History: Diagnosis: Major depressive disorder, recurrent episodes  Hospitalizations:   Outpatient Care: With Dr. Toy Care  Substance Abuse Care: None reported  Self-Mutilation: Denies  Suicidal Attempts: Denies attempts, admits attempts  Violent Behaviors: Denies   Past Medical History:   Past Medical History  Diagnosis Date  . Depression    Cardiac History:  HTN  Allergies:   Allergies  Allergen Reactions  . Codeine Itching   PTA Medications: Prescriptions prior to admission  Medication Sig Dispense Refill Last Dose  . albuterol (PROVENTIL HFA;VENTOLIN HFA) 108 (90 BASE) MCG/ACT inhaler Inhale 2 puffs into the lungs every 6 (six) hours as needed for wheezing. 1 Inhaler 0   . ALPRAZolam (XANAX) 0.25 MG tablet Take by mouth at bedtime as needed for sleep.     Marland Kitchen amoxicillin-clavulanate (AUGMENTIN) 875-125 MG per tablet Take 1 tablet by mouth 2 (two) times daily. 20 tablet 0   . azithromycin (ZITHROMAX Z-PAK) 250 MG tablet Take as directed on pack 6 tablet 0   . predniSONE (DELTASONE) 20 MG tablet 2 daily with food 10 tablet 1     Previous Psychotropic Medications:  Medication/Dose  See medication lists above               Substance Abuse History in the last 12 months:  Yes.    Consequences of Substance Abuse: Medical Consequences:  Liver damage, Possible death by overdose Legal Consequences:  Arrests, jail time, Loss of driving privilege. Family Consequences:  Family discord, divorce and or separation.  Social History:  reports that she has been smoking.  She does not have any smokeless tobacco history on file. She reports  that she does not drink alcohol or use illicit drugs. 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  Legal History: denies any legal charges  Hobbies/Interests: None reported  Family History:  History reviewed. No pertinent family history.  Results for orders placed or performed during the hospital encounter of 09/01/14 (from the past 72 hour(s))  Urinalysis, Routine w reflex microscopic     Status: None   Collection Time: 09/02/14  5:00  AM  Result Value Ref Range   Color, Urine YELLOW YELLOW   APPearance CLEAR CLEAR   Specific Gravity, Urine 1.012 1.005 - 1.030   pH 7.0 5.0 - 8.0   Glucose, UA NEGATIVE NEGATIVE mg/dL   Hgb urine dipstick NEGATIVE NEGATIVE   Bilirubin Urine NEGATIVE NEGATIVE   Ketones, ur NEGATIVE NEGATIVE mg/dL   Protein, ur NEGATIVE NEGATIVE mg/dL   Urobilinogen, UA 0.2 0.0 - 1.0 mg/dL   Nitrite NEGATIVE NEGATIVE   Leukocytes, UA NEGATIVE NEGATIVE    Comment: MICROSCOPIC NOT DONE ON URINES WITH NEGATIVE PROTEIN, BLOOD, LEUKOCYTES, NITRITE, OR GLUCOSE <1000 mg/dL. Performed at [facility]   CBC     Status: None   Collection Time: 09/02/14  6:20 AM  Result Value Ref Range   WBC 6.3 4.0 - 10.5 K/uL   RBC 4.93 3.87 - 5.11 MIL/uL   Hemoglobin 14.8 12.0 - 15.0 g/dL   HCT 43.6 36.0 - 46.0 %   MCV 88.4  78.0 - 100.0 fL   MCH 30.0 26.0 - 34.0 pg   MCHC 33.9 30.0 - 36.0 g/dL   RDW 12.4 11.5 - 15.5 %   Platelets 295 150 - 400 K/uL    Comment: Performed at [facility]  Comprehensive metabolic panel     Status: Abnormal   Collection Time: 09/02/14  6:20 AM  Result Value Ref Range   Sodium 142 137 - 147 mEq/L   Potassium 3.9 3.7 - 5.3 mEq/L   Chloride 104 96 - 112 mEq/L   CO2 28 19 - 32 mEq/L   Glucose, Bld 89 70 - 99 mg/dL   BUN 10 6 - 23 mg/dL   Creatinine, Ser 0.79 0.50 - 1.10 mg/dL   Calcium 9.5 8.4 - 10.5 mg/dL   Total Protein 7.1 6.0 - 8.3 g/dL   Albumin 3.9 3.5 - 5.2 g/dL   AST 14 0 - 37 U/L   ALT 11 0 - 35 U/L   Alkaline Phosphatase 65 39 - 117 U/L   Total Bilirubin 0.3 0.3 - 1.2 mg/dL   GFR calc non Af Amer 89 (L) >90 mL/min   GFR calc Af Amer >90 >90 mL/min    Comment: (NOTE) The eGFR has been calculated using the CKD EPI equation. This calculation has not been validated in all clinical situations. eGFR's persistently <90 mL/min signify possible Chronic Kidney Disease.    Anion gap 10 5 - 15    Comment: Performed at [facility]   Psychological Evaluations:  Assessment:   DSM5: Schizophrenia Disorders:  NA Obsessive-Compulsive Disorders:  NA Trauma-Stressor Disorders:  NA Substance/Addictive Disorders:  NA Depressive Disorders:  Major depressive disorder, recurrent episodes, severe  AXIS I:  Major depressive disorder, recurrent episodes, severe AXIS II:  Deferred AXIS III:   Past Medical History  Diagnosis Date  . Depression    AXIS IV:  occupational problems, other psychosocial or environmental problems and Familial stressors AXIS V:  11-20 some danger of hurting self or others possible OR occasionally fails to maintain minimal personal hygiene OR gross impairment in communication  Treatment Plan/Recommendations: 1. Admit for crisis management and stabilization, estimated length of stay 3-5 days.  2. Medication  management to reduce current symptoms to base line and improve the patient's overall level of functioning; Initiate vybriid for depression.  3. Treat health problems as indicated.  4. Develop treatment plan to decrease risk of relapse upon discharge and the need for readmission.  5. Psycho-social education  regarding relapse prevention and self care.  6. Health care follow up as needed for medical problems; Initiate Lisinopril 20 mg once, them 10 mg daily.  7. Review, reconcile, and reinstate any pertinent home medications for other health issues where appropriate. 8. Call for consults with hospitalist for any additional specialty patient care services as needed.  Treatment Plan Summary: Daily contact with patient to assess and evaluate symptoms and progress in treatment Medication management  Current Medications:  Current Facility-Administered Medications  Medication Dose Route Frequency Provider Last Rate Last Dose  . acetaminophen (TYLENOL) tablet 650 mg  650 mg Oral Q6H PRN Laverle Hobby, PA-C      . albuterol (PROVENTIL) (2.5 MG/3ML) 0.083% nebulizer solution 3 mL  3 mL Inhalation Q6H PRN Laverle Hobby, PA-C      . alum & mag hydroxide-simeth (MAALOX/MYLANTA) 200-200-20 MG/5ML suspension 30 mL  30 mL Oral Q4H PRN Laverle Hobby, PA-C      . hydrOXYzine (ATARAX/VISTARIL) tablet 50 mg  50 mg Oral QHS,MR X 1 Spencer E Simon, PA-C   50 mg at 09/01/14 2200  . [START ON 09/03/2014] lisinopril (PRINIVIL,ZESTRIL) tablet 10 mg  10 mg Oral Daily Encarnacion Slates, NP      . lisinopril (PRINIVIL,ZESTRIL) tablet 20 mg  20 mg Oral Once Encarnacion Slates, NP      . magnesium hydroxide (MILK OF MAGNESIA) suspension 30 mL  30 mL Oral Daily PRN Laverle Hobby, PA-C      . nicotine (NICODERM CQ - dosed in mg/24 hours) patch 21 mg  21 mg Transdermal Q0600 Nicholaus Bloom, MD   21 mg at 09/01/14 1940    Observation Level/Precautions:  15 minute checks  Laboratory:  Per ED  Psychotherapy:  Group counseling  sessions  Medications: See medication lists    Consultations: As needed   Discharge Concerns: Safety  Estimated LOS: 5-7 days  Other:     I certify that inpatient services furnished can reasonably be expected to improve the patient's condition.   Lindell Spar I, PMHNP-BC, FNP-BC 12/1/201510:14 AM   I have discussed case with NP and have met with patient. Agree with NP's note , assessment, plan Patient is a 58 year old woman, who reports a history of depression. She has been facing increased psychosocial stressors, particularly regarding stressful work environment and dealing with her adult daughter's substance dependence. She felt progressively more depressed and acutely overwhelmed, she contacted Dr. Toy Care, her outpatient psychiatrist, and was encouraged to go to hospital. Patient reports recent treatment with Viibryd, but had not been taking it regularly.

## 2014-09-02 NOTE — Progress Notes (Signed)
Patient ID: Connie Shaw, female   DOB: Mar 18, 1956, 58 y.o.   MRN: [id number] D: Patient alert and cooperative. Pt in dayroom interacting well with peers. Pt reports she talked to her daughter and that put her mind at ease. Pt c/o anxiety. Pt states she feels safe on the hall. Pt denies SI/HI/AVH.   A: Medications administered as prescribed. Emotional support given and will continue to monitor pt's progress for stabilization.  R: Patient is safe and complaint with medications.

## 2014-09-02 NOTE — Progress Notes (Signed)
Pt attended spiritual care group on grief and loss facilitated by counseling intern Martinique Austin and chaplain Jerene Pitch. Group opened with brief discussion and psycho-social ed around grief and loss in relationships and in relation to self - identifying life patterns, circumstances, changes that cause losses. Established group norm of speaking from own life experience. Group goal of establishing open and affirming space for members to share loss and experience with grief, normalize grief experience and provide psycho social education and grief support.  Lelon Frohlich was present throughout group and presented with appropriate behaviors. Ann connected with another group member when asking if they should visit a gravesite, and Lelon Frohlich replied how it had been helpful for her in the past. Lelon Frohlich was supportive of other group members, though did not share about her own grief and loss experiences.   Martinique Austin Counseling Intern
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
## 2014-09-02 NOTE — Progress Notes (Signed)
Recreation Therapy Notes  Animal-Assisted Activity/Therapy (AAA/T) Program Checklist/Progress Notes Patient Eligibility Criteria Checklist & Daily Group note for Rec Tx Intervention  Date: 12.01.2015 Time: 2:45pm Location: [address]   AAA/T Program Assumption of Risk Form signed by Patient/ or Parent Legal Guardian yes  Patient is free of allergies or sever asthma yes  Patient reports no fear of animals yes  Patient reports no history of cruelty to animals yes  Patient understands his/her participation is voluntary yes  Patient washes hands before animal contact yes  Patient washes hands after animal contact yes  Behavioral Response: Appropriate   Education: Hand Washing, Appropriate Animal Interaction   Education Outcome: Acknowledges education.   Clinical Observations/Feedback: Patient interacted with therapy dog, petting him appropriately. Patient shared stories about her pet at home and socialized with peers in session, conversation remained appropriate.   Laureen Ochs Connie Shaw, LRT/CTRS  Connie Shaw L 09/02/2014 5:07 PM

## 2014-09-02 NOTE — Plan of Care (Signed)
Problem: Ineffective individual coping Goal: STG: Patient will remain free from self harm Outcome: Progressing Pt is safe and free of self harm.     

## 2014-09-02 NOTE — Plan of Care (Signed)
Problem: Ineffective individual coping Goal: STG: Patient will remain free from self harm Outcome: Progressing

## 2014-09-02 NOTE — BHH Group Notes (Signed)
[facility] LCSW Group Therapy      Feelings About Diagnosis 1:15 - 2:30 PM         09/02/2014  3:28 PM  Type of Therapy:  Group Therapy  Participation Level:  Active  Participation Quality:  Appropriate  Affect:  Appropriate  Cognitive:  Alert and Appropriate  Insight:  Developing/Improving and Engaged  Engagement in Therapy:  Developing/Improving and Engaged  Modes of Intervention:  Discussion, Education, Exploration, Problem-Solving, Rapport Building, Support  Summary of Progress/Problems:  Patient actively participated in group. Patient discussed past and present diagnosis and the effects it has had on  life.  Patient talked about family and society being judgmental and the stigma associated with having a mental health diagnosis.   Patient shared she is very embarrassed to have a mental health diagnosis.  She stated she is fearful her employment will find out.  Connie Shaw 09/02/2014  3:28 PM

## 2014-09-03 DIAGNOSIS — F332 Major depressive disorder, recurrent severe without psychotic features: Secondary | ICD-10-CM | POA: Insufficient documentation

## 2014-09-03 DIAGNOSIS — F329 Major depressive disorder, single episode, unspecified: Secondary | ICD-10-CM

## 2014-09-03 LAB — DRUGS OF ABUSE SCREEN W/O ALC, ROUTINE URINE
AMPHETAMINE SCRN UR: NEGATIVE
BENZODIAZEPINES.: POSITIVE — AB
Barbiturate Quant, Ur: NEGATIVE
COCAINE METABOLITES: NEGATIVE
Creatinine,U: 89.7 mg/dL
Marijuana Metabolite: NEGATIVE
Methadone: NEGATIVE
Opiate Screen, Urine: NEGATIVE
PHENCYCLIDINE (PCP): NEGATIVE
PROPOXYPHENE: NEGATIVE

## 2014-09-03 MED ORDER — VILAZODONE HCL 10 MG PO TABS
10.0000 mg | ORAL_TABLET | Freq: Every day | ORAL | Status: DC
  Administered 2014-09-03: 10 mg via ORAL
  Filled 2014-09-03 (×3): qty 1

## 2014-09-03 MED ORDER — HYDROXYZINE HCL 25 MG PO TABS: 25.0000 mg | ORAL_TABLET | Freq: Four times a day (QID) | ORAL | Status: DC | PRN

## 2014-09-03 MED ORDER — VILAZODONE HCL 10 MG PO TABS: 10.0000 mg | ORAL_TABLET | Freq: Every day | ORAL | Status: DC

## 2014-09-03 MED ORDER — LISINOPRIL 10 MG PO TABS: 10.0000 mg | ORAL_TABLET | Freq: Every day | ORAL | Status: DC

## 2014-09-03 NOTE — BHH Group Notes (Signed)
[facility] LCSW Group Therapy  Emotional Regulation 1:15 - 2: 30 PM        09/03/2014  3:05 PM    Type of Therapy:  Group Therapy  Participation Level:  Appropriate  Participation Quality:  Appropriate  Affect:  Appropriate  Cognitive:  Attentive Appropriate  Insight:  Developing/Improving Engaged  Engagement in Therapy:  Developing/Improving Engaged  Modes of Intervention:  Discussion Exploration Problem-Solving Supportive  Summary of Progress/Problems:  Group topic was emotional regulations.  Patient participated in the discussion and was able to identify an emotion that needed to regulated.  She advised the emotion she has to handle is sadness.  She shared she is an only child and has no family except the adult daughter she cares for.  Patient shared she has no support.  Patient was encouraged to get involved with a support group such as [place] to build a support system.  Concha Pyo 09/03/2014 3:05 PM

## 2014-09-03 NOTE — BHH Group Notes (Signed)
[facility] LCSW Aftercare Discharge Planning Group Note   09/03/2014 10:24 AM    Participation Quality:  Appropraite  Mood/Affect:  Appropriate  Depression Rating:  2  Anxiety Rating:  2  Thoughts of Suicide:  No  Will you contract for safety?   NA  Current AVH:  No  Plan for Discharge/Comments:  Patient attended discharge planning group and actively participated in group. She reports doing much better today and hopes to discharge soon.  She is followed by Dr. Toy Care for outpatient services. Suicide prevention education reviewed and SPE document provided.   Transportation Means: Patient has transportation.   Supports:  Patient has a support system.   Lizzie Cokley, Eulas Post

## 2014-09-03 NOTE — [facility] Suicide Risk Assessment (Addendum)
Demographic Factors:  58 year old divorced  female, lives with adult daughter, employed.  Total Time spent with patient: 30 minutes  Psychiatric Specialty Exam: Physical Exam  ROS  Blood pressure 140/66, pulse 60, temperature 98 F (36.7 C), temperature source Oral, resp. rate 20, height 5\' 6"  (1.676 m), weight 73.483 kg (162 lb).Body mass index is 26.16 kg/(m^2).  General Appearance: Well Groomed  Engineer, water::  Good  Speech:  Normal Rate  Volume:  Normal  Mood:  Euthymic- at this time denies depression  Affect:  Appropriate and Full Range  Thought Process:  Goal Directed and Linear  Orientation:  Full (Time, Place, and Person)  Thought Content:  denies hallucinations, no delusions  Suicidal Thoughts:  No At this time denies any suicidal plan or intent and contracts for safety on the unit  Homicidal Thoughts:  No  Memory:  recent and remote grossly intact  Judgement:  Other:  improved  Insight:  Present  Psychomotor Activity:  Normal  Concentration:  Good  Recall:  Good  Fund of Knowledge:Good  Language: Good  Akathisia:  No  Handed:  Right  AIMS (if indicated):     Assets:  Communication Skills Desire for Improvement Housing Resilience Vocational/Educational  Sleep:  Number of Hours: 6.75    Musculoskeletal: Strength & Muscle Tone: within normal limits Gait & Station: normal Patient leans: N/A   Mental Status Per Nursing Assessment::   On Admission:     Current Mental Status by Physician: At this time patient is improved. She is euthymic, denies any depression, affect is bright, reactive, no thought disorder, no suicidal or homicidal ideations, no hallucinations, no delusions. Behavior on unit in good control. Future oriented, wants to return home to be with daughter and is planning on going back to Dr. Toy Care for outpatient psychiatric management. Plans to go to [place].  Loss Factors: Stressful work environment and adult daughter has history of opiate  dependence   Historical Factors: one prior admission in 1988 for depression, no history of suicide attempts, no history of self cutting  Risk Reduction Factors:   Sense of responsibility to family, Employed, Living with another person, especially a relative, Positive social support and Positive coping skills or problem solving skills  Continued Clinical Symptoms:  As noted, at this time mood improved,is currently euthymic, with full range of affect, no thought disorder, no SI or HI, no psychotic symptoms. Future oriented. She is wanting to discharge and at this time there are no grounds for any involuntary commitment.   Cognitive Features That Contribute To Risk:  No gross cognitive deficits noted upon discharge. Is alert , attentive, and oriented x 3   Suicide Risk:  Mild:  Suicidal ideation of limited frequency, intensity, duration, and specificity.  There are no identifiable plans, no associated intent, mild dysphoria and related symptoms, good self-control (both objective and subjective assessment), few other risk factors, and identifiable protective factors, including available and accessible social support.  Discharge Diagnoses:   AXIS I:  Depression NOS, history of MDD. AXIS II:  Deferred AXIS III:   Past Medical History  Diagnosis Date  . Depression    AXIS IV:  occupational problems and problems related to social environment AXIS V:  61-70 mild symptoms  Plan Of Care/Follow-up recommendations:  Activity:  As tolerated Diet:  Regular Tests:  NA Other:  see below  Is patient on multiple antipsychotic therapies at discharge:  No   Has Patient had three or more failed trials  of antipsychotic monotherapy by history:  No  Recommended Plan for Multiple Antipsychotic Therapies: NA Patient is requesting discharge and at this time there are no grounds for involuntary commitment. Patient is leaving unit in good spirits. She plans to  Return home. She is planning to follow  up with her outpatient psychiatrist, Dr. Toy Care. I called Dr. Starleen Arms Office and she has an appt to see her tomorrow 12/3 at 63, 30 AM. She is planning to start seeing a PCP, she does have an established Ob/Gyn physician.  COBOS, [place] 09/03/2014, 2:58 PM

## 2014-09-03 NOTE — Progress Notes (Signed)
D:  Patient's self inventory sheet, patient slept good last night, sleep medication is helpful.  Good appetite, normal energy level, good concentration.  Rated depression and anxiety 2, denied hopeless.  Denied withdrawals.  Denied SI.  Denied physical problems.  Denied pain.  Goal is to go home.  Plans to think about the changes that she needs to make.  Does have discharge plans.  No problems anticipated after discharge. A:  Medications administered per MD orders.  Emotional support and encouragement given patient. R:  Denied SI and HI.  Denied A/V hallucinations.  Contracts for safety.  Safety maintained with 15 minute checks.

## 2014-09-03 NOTE — Plan of Care (Signed)
Problem: Ineffective individual coping Goal: STG: Patient will participate in after care plan Patient will attend attend groups and engage in discussion. Outpatient follow up appointment will be scheduled.  Connie Shaw Connie Loge, LCSW 09/02/2014 1:07 PM  Patient has attended groups and easily engaged in discussions. Outpatient follow up is scheduled with Dr. Toy Care. Connie Whalley, LCSW 09/03/2014    Outcome: Completed/Met Date Met:  09/03/14

## 2014-09-03 NOTE — Plan of Care (Signed)
Problem: Consults Goal: Suicide Risk Patient Education (See Patient Education module for education specifics)  Outcome: Completed/Met Date Met:  09/03/14 Nurse talked with patient about suicidal thoughts.

## 2014-09-03 NOTE — Plan of Care (Signed)
Problem: Alteration in mood Goal: LTG-Pt's behavior demonstrates decreased signs of depression Goal not met. Patient is rating depression at five. Goal is for patient to rate depression at four or below prior to discharge.  Burnis Medin Chibuike Fleek, LCSW 09/02/2014 1:08 PM   Goal met. Patient is rating depression at two. Goal is for depression to be rated at three or below on discharge.  Burnis Medin Dekisha Mesmer, LCSW 09/03/2014 10:27 AM    (Patient's behavior demonstrates decreased signs of depression to the point the patient is safe to return home and continue treatment in an outpatient setting)  Outcome: Completed/Met Date Met:  09/03/14

## 2014-09-03 NOTE — Discharge Summary (Signed)
Physician Discharge Summary Note  Patient:  Connie Shaw is an 58 y.o., female MRN:  [id number] DOB:  [birth year] Patient phone:  [phone] (home)  Patient address:   [place] [address],  Total Time spent with patient: 30 minutes  Date of Admission:  09/01/2014 Date of Discharge: 09/03/14  Reason for Admission:  Depression  Discharge Diagnoses: Active Problems:   MDD (major depressive disorder), recurrent episode, severe  Psychiatric Specialty Exam: Physical Exam  Review of Systems  Constitutional: Negative.   HENT: Negative.   Eyes: Negative.   Respiratory: Negative.   Cardiovascular: Negative.   Gastrointestinal: Negative.   Genitourinary: Negative.   Musculoskeletal: Negative.   Skin: Negative.   Neurological: Negative.   Endo/Heme/Allergies: Negative.   Psychiatric/Behavioral: Positive for depression (Stabilized with treatment).    Blood pressure 140/66, pulse 60, temperature 98 F (36.7 C), temperature source Oral, resp. rate 20, height _0  (1.676 m), weight 73.483 kg (162 lb).Body mass index is 26.16 kg/(m^2).  See Physician SRA     Past Psychiatric History: See H&P Diagnosis:  Hospitalizations:  Outpatient Care:  Substance Abuse Care:  Self-Mutilation:  Suicidal Attempts:  Violent Behaviors:   Musculoskeletal: Strength & Muscle Tone: within normal limits Gait & Station: normal Patient leans: N/A  DSM5: AXIS I: Depression NOS, history of MDD. AXIS II: Deferred AXIS III:  Past Medical History  Diagnosis Date  . Depression    AXIS IV: occupational problems and problems related to social environment AXIS V: 61-70 mild symptoms  Level of Care:  OP  Hospital Course:  Connie Shaw is a 58 year old Caucasian female, admitted to [facility] as a walk-in. She reports, "I was at work, a horrible work environment. I was doing most of the work while everybody else was chatting and carrying on on the telephone. That  created a lot of anxiety for me. My head started to hurt real bad. I realized that I could no longer handle or cope with this, this work environment is not healthy or suitable for me. I called my psychiatrist Dr. Toy Care, she directed me to come to this hospital. I walked in. I'm very stressed at home too, taking care of my daughter. She is 66 years old, has a lot of chronic medical issues, series of back surgeries, always in pain. Now she is addicted to her narcotics. The thought of that is hurting me. I have been dealing with depression and bad anxiety x 5 years. But, right now, I feel all right. I can go home. I don't belong here. I was on depression medicine called Zybrid a while ago, given to me by Dr. Toy Care. It was working for me, but I stopped it with the belief that I was well. I need to get back on it".         Connie Shaw was admitted to the adult unit where she was evaluated and her symptoms were identified. Medication management was discussed and implemented. Patient was started again on the medication Viibryd 10 mg daily as she reported it had worked well in the past. She received vistaril 25 mg as needed for complaints of anxiety.  She was encouraged to participate in unit programming. Medical problems were identified and treated appropriately. Patient was started on Lisinopril 10 mg daily as it was noted that her blood pressure was elevated. Her blood pressure readings prior to discharge were significantly improved. Home medication was restarted as needed.  She was evaluated each day  by a clinical provider to ascertain the patient's response to treatment.  Improvement was noted by the patient's report of decreasing symptoms, improved sleep and appetite, affect, medication tolerance, behavior, and participation in unit programming.  The patient was asked each day to complete a self inventory noting mood, mental status, pain, new symptoms, anxiety and concerns.         She responded well to  medication and being in a therapeutic and supportive environment. Positive and appropriate behavior was noted and the patient was motivated for recovery.  She worked closely with the treatment team and case manager to develop a discharge plan with appropriate goals. Coping skills, problem solving as well as relaxation therapies were also part of the unit programming.         By the day of discharge she was in much improved condition than upon admission.  Symptoms were reported as significantly decreased or resolved completely. The patient denied SI/HI and voiced no AVH. She was motivated to continue taking medication with a goal of continued improvement in mental health. Connie Shaw was discharged home with a plan to follow up as noted below. Patient was provided with prescriptions and sample medications at time of discharge. She left [facility] in no acute distress with all belongings returned to her.   Consults:  None  Significant Diagnostic Studies:  Chemistry panel, CBC, TSH, UDS, UA,   Discharge Vitals:   Blood pressure 140/66, pulse 60, temperature 98 F (36.7 C), temperature source Oral, resp. rate 20, height _0  (1.676 m), weight 73.483 kg (162 lb). Body mass index is 26.16 kg/(m^2). Lab Results:   Results for orders placed or performed during the hospital encounter of 09/01/14 (from the past 72 hour(s))  Urinalysis, Routine w reflex microscopic     Status: None   Collection Time: 09/02/14  5:00 AM  Result Value Ref Range   Color, Urine YELLOW YELLOW   APPearance CLEAR CLEAR   Specific Gravity, Urine 1.012 1.005 - 1.030   pH 7.0 5.0 - 8.0   Glucose, UA NEGATIVE NEGATIVE mg/dL   Hgb urine dipstick NEGATIVE NEGATIVE   Bilirubin Urine NEGATIVE NEGATIVE   Ketones, ur NEGATIVE NEGATIVE mg/dL   Protein, ur NEGATIVE NEGATIVE mg/dL   Urobilinogen, UA 0.2 0.0 - 1.0 mg/dL   Nitrite NEGATIVE NEGATIVE   Leukocytes, UA NEGATIVE NEGATIVE    Comment: MICROSCOPIC NOT DONE ON URINES WITH  NEGATIVE PROTEIN, BLOOD, LEUKOCYTES, NITRITE, OR GLUCOSE <1000 mg/dL. Performed at [facility]   Drugs of abuse screen w/o alc, routine urine (ref lab)     Status: Abnormal   Collection Time: 09/02/14  5:00 AM  Result Value Ref Range   Marijuana Metabolite NEGATIVE Negative   Amphetamine Screen, Ur NEGATIVE Negative   Barbiturate Quant, Ur NEGATIVE Negative   Methadone NEGATIVE Negative   Benzodiazepines. POSITIVE (A) Negative    Comment: (NOTE) Result repeated and verified. Sent for confirmatory testing    Phencyclidine (PCP) NEGATIVE Negative   Cocaine Metabolites NEGATIVE Negative   Opiate Screen, Urine NEGATIVE Negative   Propoxyphene NEGATIVE Negative   Creatinine,U 89.7 mg/dL    Comment: (NOTE) Cutoff Values for Urine Drug Screen:        Drug Class           Cutoff (ng/mL)        Amphetamines            1000        Barbiturates  200        Cocaine Metabolites      300        Benzodiazepines          200        Methadone                300        Opiates                 2000        Phencyclidine             25        Propoxyphene             300        Marijuana Metabolites     50 For medical purposes only. Performed at [facility]   CBC     Status: None   Collection Time: 09/02/14  6:20 AM  Result Value Ref Range   WBC 6.3 4.0 - 10.5 K/uL   RBC 4.93 3.87 - 5.11 MIL/uL   Hemoglobin 14.8 12.0 - 15.0 g/dL   HCT 43.6 36.0 - 46.0 %   MCV 88.4 78.0 - 100.0 fL   MCH 30.0 26.0 - 34.0 pg   MCHC 33.9 30.0 - 36.0 g/dL   RDW 12.4 11.5 - 15.5 %   Platelets 295 150 - 400 K/uL    Comment: Performed at [facility]  Comprehensive metabolic panel     Status: Abnormal   Collection Time: 09/02/14  6:20 AM  Result Value Ref Range   Sodium 142 137 - 147 mEq/L   Potassium 3.9 3.7 - 5.3 mEq/L   Chloride 104 96 - 112 mEq/L   CO2 28 19 - 32 mEq/L   Glucose, Bld 89 70 - 99 mg/dL   BUN 10 6 - 23 mg/dL   Creatinine, Ser 0.79  0.50 - 1.10 mg/dL   Calcium 9.5 8.4 - 10.5 mg/dL   Total Protein 7.1 6.0 - 8.3 g/dL   Albumin 3.9 3.5 - 5.2 g/dL   AST 14 0 - 37 U/L   ALT 11 0 - 35 U/L   Alkaline Phosphatase 65 39 - 117 U/L   Total Bilirubin 0.3 0.3 - 1.2 mg/dL   GFR calc non Af Amer 89 (L) >90 mL/min   GFR calc Af Amer >90 >90 mL/min    Comment: (NOTE) The eGFR has been calculated using the CKD EPI equation. This calculation has not been validated in all clinical situations. eGFR's persistently <90 mL/min signify possible Chronic Kidney Disease.    Anion gap 10 5 - 15    Comment: Performed at [facility]  TSH     Status: None   Collection Time: 09/02/14  6:20 AM  Result Value Ref Range   TSH 1.840 0.350 - 4.500 uIU/mL    Comment: Performed at [facility]    Physical Findings: AIMS: Facial and Oral Movements Muscles of Facial Expression: None, normal Lips and Perioral Area: None, normal Jaw: None, normal Tongue: None, normal,Extremity Movements Upper (arms, wrists, hands, fingers): None, normal Lower (legs, knees, ankles, toes): None, normal, Trunk Movements Neck, shoulders, hips: None, normal, Overall Severity Severity of abnormal movements (highest score from questions above): None, normal Incapacitation due to abnormal movements: None, normal Patient's awareness of abnormal movements (rate only patient's report): No Awareness, Dental Status Current problems with teeth and/or dentures?: No Does patient usually wear dentures?: No  CIWA:  CIWA-Ar Total: 1 COWS:  COWS Total Score: 1  Psychiatric Specialty Exam: See Psychiatric Specialty Exam and Suicide Risk Assessment completed by Attending Physician prior to discharge.  Discharge destination:  Home  Is patient on multiple antipsychotic therapies at discharge:  No   Has Patient had three or more failed trials of antipsychotic monotherapy by history:  No  Recommended Plan for Multiple Antipsychotic Therapies: NA      Medication List    STOP taking these medications        ALPRAZolam 0.25 MG tablet  Commonly known as:  XANAX     amoxicillin-clavulanate 875-125 MG per tablet  Commonly known as:  AUGMENTIN     azithromycin 250 MG tablet  Commonly known as:  ZITHROMAX Z-PAK     predniSONE 20 MG tablet  Commonly known as:  DELTASONE      TAKE these medications      Indication   albuterol 108 (90 BASE) MCG/ACT inhaler  Commonly known as:  PROVENTIL HFA;VENTOLIN HFA  Inhale 2 puffs into the lungs every 6 (six) hours as needed for wheezing.      hydrOXYzine 25 MG tablet  Commonly known as:  ATARAX/VISTARIL  Take 1 tablet (25 mg total) by mouth every 6 (six) hours as needed for anxiety.   Indication:  Tension, Anxiety     lisinopril 10 MG tablet  Commonly known as:  PRINIVIL,ZESTRIL  Take 1 tablet (10 mg total) by mouth daily.   Indication:  High Blood Pressure     Vilazodone HCl 10 MG Tabs  Commonly known as:  VIIBRYD  Take 1 tablet (10 mg total) by mouth daily.   Indication:  Major Depressive Disorder           Follow-up Information    Follow up with Dr. Toy Care On 09/09/2014.   Why:  You are scheduled with Dr. Toy Care on Tuesday, September 09, 2014 at 12:45 PM   Contact information:   [place],    [address]  [phone]      Follow-up recommendations:   Activity: As tolerated Diet: Regular Tests: NA Other: see below  Comments:   Take all your medications as prescribed by your mental healthcare provider.  Report any adverse effects and or reactions from your medicines to your outpatient provider promptly.  Patient is instructed and cautioned to not engage in alcohol and or illegal drug use while on prescription medicines.  In the event of worsening symptoms, patient is instructed to call the crisis hotline, 911 and or go to the nearest ED for appropriate evaluation and treatment of symptoms.  Follow-up with your primary care provider for your other  medical issues, concerns and or health care needs.   Total Discharge Time:  Greater than 30 minutes.  SignedElmarie Shiley NP-C 09/03/2014, 3:11 PM   Patient seen, Suicide Assessment Completed.  Disposition Plan Reviewed

## 2014-09-03 NOTE — Plan of Care (Signed)
Problem: Diagnosis: Increased Risk For Suicide Attempt Goal: STG-Patient Will Comply With Medication Regime Outcome: Progressing Pt is safe and compliant with prescribed medication regimen     

## 2014-09-03 NOTE — Progress Notes (Signed)
Discharge Note:  Patient discharged home.  Denied SI and HI.  Denied A/V hallucinations.  Denied pain.  Suicide prevention information given and discussed with patient who stated she understood and had no questions.  Patient stated she received all her belongings, clothing, toiletries, misc items, prescriptions, medications, watch, earrings, ring, ID badge, jacket, keys, handbag with papers, cell phone, cards, etc.  Patient stated she appreciated all assistance received from [facility] staff.

## 2014-09-04 NOTE — Progress Notes (Signed)
[facility] Adult Case Management Discharge Plan :  Late Entry for Unexpected Discharge on 09/03/14  Will you be returning to the same living situation after discharge: Yes,  Patient was returning to her home. At discharge, do you have transportation home?:Yes,  Patient arranged transportation home. Do you have the ability to pay for your medications:Yes,  Patient able to obtain medications.  Release of information consent forms completed and in the chart;  Patient's signature needed at discharge.  Patient to Follow up at: Follow-up Information    Follow up with Dr. Toy Care On 09/09/2014.   Why:  You are scheduled with Dr. Toy Care on Tuesday, September 09, 2014 at 12:45 PM   Contact information:   [place], [place]   [address]  [phone]      Patient denies SI/HI:   Patient no longer endorsing SI/HI or other thoughts of self harm.     Safety Planning and Suicide Prevention discussed: .Reviewed with all patients during discharge planning group  Julius Matus, [place] 09/04/2014, 9:49 AM

## 2014-09-05 NOTE — Progress Notes (Signed)
Patient Discharge Instructions:  After Visit Summary (AVS):   Faxed to:  09/05/14 Discharge Summary Note:   Faxed to:  09/05/14 Psychiatric Admission Assessment Note:   Faxed to:  09/05/14 Suicide Risk Assessment - Discharge Assessment:   Faxed to:  09/05/14 Faxed/Sent to the Next [facility] Care provider:  09/05/14 Faxed to [place] [facility] [place], 09/05/2014, 3:00 PM

## 2014-09-16 ENCOUNTER — Ambulatory Visit (INDEPENDENT_AMBULATORY_CARE_PROVIDER_SITE_OTHER): Payer: 59 | Admitting: Podiatry

## 2014-09-16 ENCOUNTER — Encounter: Payer: Self-pay | Admitting: Podiatry

## 2014-09-16 VITALS — BP 129/78 | HR 64 | Resp 12

## 2014-09-16 DIAGNOSIS — Q828 Other specified congenital malformations of skin: Secondary | ICD-10-CM

## 2014-09-16 DIAGNOSIS — D367 Benign neoplasm of other specified sites: Secondary | ICD-10-CM

## 2014-09-16 DIAGNOSIS — L603 Nail dystrophy: Secondary | ICD-10-CM

## 2014-09-16 NOTE — Progress Notes (Signed)
   Subjective:    Patient ID: Connie Shaw, female    DOB: 19-Sep-1956, 58 y.o.   MRN: [id number]  HPI PT STATED LT FOOT HEEL HAVE WARTS AND BEEN HURTING FOR 5 WEEKS. THE HEEL IS GETTING WORSE AND GETTING BIGGER. THE FOOT GET AGGRAVATED BY PRESSURE. TRIED WEARING PADDING FOR HEEL SUPPORT BUT NO HELP.  ALSO, CHECK LT FOOT TOENAIL IS THICK AND HAVE DISCOLORATION.   Review of Systems  Constitutional: Positive for fatigue.  Endocrine: Positive for cold intolerance.  Psychiatric/Behavioral: The patient is nervous/anxious.   All other systems reviewed and are negative.      Objective:   Physical Exam: I have reviewed her past medical history medications allergies surgical history and review of systems. Pulses are strongly palpable bilateral. Neurologic sensorium is intact per Semmes-Weinstein monofilament. Deep tendon reflexes are intact bilateral and muscle strength is 5 over 5 dorsiflexion plantar flexors and inverters and everters onto the musculature is intact. Cutaneous evaluation demonstrates supple well-hydrated cutis with exception of a solitary for keratoma to the plantar aspect of her left heel. This soft tissue lesion is painful on mediolateral compression and direct palpation. It does not readily bleed upon debridement and skin lines do not circumvent the lesion. This does not appear to be wart. She also has a thick yellow white crusty friable dystrophic nail hallux left. I see no signs of tinea pedis.        Assessment & Plan:  Assessment: Porokeratosis plantar aspect left heel. Dystrophic nail hallux left.  Plan: Estill Bamberg skin and nail were sent for pathologic evaluation today. I also debrided the reactive keratoma. I placed salicylic acid under occlusion to be removed in 3 days and I will follow-up with her in 6 weeks if necessary.

## 2014-09-17 ENCOUNTER — Ambulatory Visit (INDEPENDENT_AMBULATORY_CARE_PROVIDER_SITE_OTHER): Payer: 59 | Admitting: Family Medicine

## 2014-09-17 VITALS — BP 126/60 | HR 69 | Temp 98.0°F | Resp 16 | Ht 65.5 in | Wt 160.4 lb

## 2014-09-17 DIAGNOSIS — F17200 Nicotine dependence, unspecified, uncomplicated: Secondary | ICD-10-CM

## 2014-09-17 DIAGNOSIS — R030 Elevated blood-pressure reading, without diagnosis of hypertension: Secondary | ICD-10-CM

## 2014-09-17 DIAGNOSIS — F419 Anxiety disorder, unspecified: Secondary | ICD-10-CM

## 2014-09-17 DIAGNOSIS — Z720 Tobacco use: Secondary | ICD-10-CM

## 2014-09-17 NOTE — Progress Notes (Signed)
Subjective: Patient was recently hospitalized on psychiatry for a problem with anxiety. Her psychiatrist Dr Toy Care is someone who is cared for her for a long time. She had a very high blood pressure when she went into the hospital. I don't again to her old medical record and the highest I could find had a diastolic of 99. She left the hospital on lisinopril, continue to a couple of weeks, and stopped it because her blood pressures were running good. Her psychiatrist wants her to follow up with a primary care doctor for the blood pressure. She does continue to smoke. Otherwise has been fairly healthy person.  Objective: Healthy-appearing lady. Neck supple without nodes or thyromegaly. Chest is clear to all station. Heart regular without any murmurs no ankle edema.  Assessment: Elevated blood pressure, improved History of anxiety Tobacco use disorder  Plan: No blood pressure medication at this time. Return this spring or summer in 4-6 months. Keep track of her blood pressure readings.  Long discussion about the need to stop smoking.

## 2014-09-17 NOTE — Patient Instructions (Addendum)
Avoid excessive caffeine  Think seriously about when you aren't going to try and stop smoking  Get regular exercise  Monitor your blood pressure couple times a week. Your goal is to have a blood pressure less than 140 over less than 90 consistently, with ideal being down around 120/70.  Return in about 4-6 months for a follow-up and bring your record with you. If you're having problems with it running high very often before then come in sooner.  Informed Dr.Kaur that in my opinion from a medical perspective it is safe for you to return to work

## 2014-09-17 NOTE — Progress Notes (Signed)
Subjective

## 2014-10-15 ENCOUNTER — Encounter: Payer: Self-pay | Admitting: Podiatry

## 2014-10-23 ENCOUNTER — Ambulatory Visit: Payer: Self-pay | Admitting: Podiatry

## 2014-10-28 ENCOUNTER — Ambulatory Visit (INDEPENDENT_AMBULATORY_CARE_PROVIDER_SITE_OTHER): Payer: 59 | Admitting: Podiatry

## 2014-10-28 ENCOUNTER — Ambulatory Visit: Payer: Self-pay | Admitting: Podiatry

## 2014-10-28 DIAGNOSIS — Z79899 Other long term (current) drug therapy: Secondary | ICD-10-CM

## 2014-10-28 MED ORDER — TERBINAFINE HCL 250 MG PO TABS: 250.0000 mg | ORAL_TABLET | Freq: Every day | ORAL | Status: DC

## 2014-10-28 NOTE — Progress Notes (Signed)
She presents today for a follow-up of a positive fungal culture.  Objective: Hallux nail plate left is positive for onychomycosis separate phytic fungus.  Assessment: Onychomycosis hallux left.  Plan: We discussed starting her on Lamisil 200 mg tablets one by mouth daily for 30 days. We will also obtain liver profile and CBC. Should she have questions or concerns she will notify S immediately. He discussed the pros and cons of this type of therapy she understands that it holes risks as well as benefits.

## 2014-11-07 ENCOUNTER — Telehealth: Payer: Self-pay | Admitting: *Deleted

## 2014-11-07 NOTE — Telephone Encounter (Signed)
I submitted request for authorization of Zyvox on CoverMyMeds.  I received a fax from [facility] that Zyvox was approved from 11/07/14-02/05/2015.

## 2014-11-07 NOTE — Telephone Encounter (Signed)
I called to get authorization of Lamisil.  I answered all clinical questions.  Lamisil was approved  I faxed the pharmacy to inform them that the medication was approved.

## 2014-11-23 ENCOUNTER — Emergency Department (HOSPITAL_BASED_OUTPATIENT_CLINIC_OR_DEPARTMENT_OTHER)
Admission: EM | Admit: 2014-11-23 | Discharge: 2014-11-23 | Disposition: A | Discharge status: Home / Self Care | Payer: 59 | Attending: Emergency Medicine | Admitting: Emergency Medicine

## 2014-11-23 ENCOUNTER — Encounter (HOSPITAL_BASED_OUTPATIENT_CLINIC_OR_DEPARTMENT_OTHER): Payer: Self-pay | Admitting: *Deleted

## 2014-11-23 DIAGNOSIS — Z79899 Other long term (current) drug therapy: Secondary | ICD-10-CM | POA: Diagnosis not present

## 2014-11-23 DIAGNOSIS — Z720 Tobacco use: Secondary | ICD-10-CM | POA: Diagnosis not present

## 2014-11-23 DIAGNOSIS — I10 Essential (primary) hypertension: Secondary | ICD-10-CM | POA: Diagnosis not present

## 2014-11-23 DIAGNOSIS — R635 Abnormal weight gain: Secondary | ICD-10-CM | POA: Diagnosis not present

## 2014-11-23 DIAGNOSIS — I160 Hypertensive urgency: Secondary | ICD-10-CM

## 2014-11-23 DIAGNOSIS — F329 Major depressive disorder, single episode, unspecified: Secondary | ICD-10-CM | POA: Diagnosis not present

## 2014-11-23 LAB — CBC WITH DIFFERENTIAL/PLATELET
BASOS ABS: 0 10*3/uL (ref 0.0–0.1)
Basophils Relative: 1 % (ref 0–1)
Eosinophils Absolute: 0.1 10*3/uL (ref 0.0–0.7)
Eosinophils Relative: 2 % (ref 0–5)
HEMATOCRIT: 41.8 % (ref 36.0–46.0)
HEMOGLOBIN: 14.5 g/dL (ref 12.0–15.0)
LYMPHS PCT: 49 % — AB (ref 12–46)
Lymphs Abs: 3.2 10*3/uL (ref 0.7–4.0)
MCH: 30.2 pg (ref 26.0–34.0)
MCHC: 34.7 g/dL (ref 30.0–36.0)
MCV: 87.1 fL (ref 78.0–100.0)
Monocytes Absolute: 0.5 10*3/uL (ref 0.1–1.0)
Monocytes Relative: 8 % (ref 3–12)
Neutro Abs: 2.6 10*3/uL (ref 1.7–7.7)
Neutrophils Relative %: 40 % — ABNORMAL LOW (ref 43–77)
PLATELETS: 263 10*3/uL (ref 150–400)
RBC: 4.8 MIL/uL (ref 3.87–5.11)
RDW: 11.9 % (ref 11.5–15.5)
WBC: 6.6 10*3/uL (ref 4.0–10.5)

## 2014-11-23 LAB — BASIC METABOLIC PANEL
Anion gap: <3 — ABNORMAL LOW (ref 5–15)
BUN: 13 mg/dL (ref 6–23)
CHLORIDE: 113 mmol/L — AB (ref 96–112)
CO2: 24 mmol/L (ref 19–32)
CREATININE: 0.8 mg/dL (ref 0.50–1.10)
Calcium: 8.6 mg/dL (ref 8.4–10.5)
GFR calc Af Amer: >90 mL/min (ref >90)
GFR calc non Af Amer: 79 mL/min — ABNORMAL LOW (ref >90)
GLUCOSE: 105 mg/dL — AB (ref 70–99)
Potassium: 4 mmol/L (ref 3.5–5.1)
SODIUM: 138 mmol/L (ref 135–145)

## 2014-11-23 LAB — URINALYSIS, ROUTINE W REFLEX MICROSCOPIC
Bilirubin Urine: NEGATIVE
GLUCOSE, UA: NEGATIVE mg/dL
Hgb urine dipstick: NEGATIVE
Ketones, ur: NEGATIVE mg/dL
LEUKOCYTES UA: NEGATIVE
Nitrite: NEGATIVE
PROTEIN: NEGATIVE mg/dL
Specific Gravity, Urine: 1.004 — ABNORMAL LOW (ref 1.005–1.030)
UROBILINOGEN UA: 0.2 mg/dL (ref 0.0–1.0)
pH: 5.5 (ref 5.0–8.0)

## 2014-11-23 LAB — BRAIN NATRIURETIC PEPTIDE: B Natriuretic Peptide: 16.1 pg/mL (ref 0.0–100.0)

## 2014-11-23 NOTE — ED Provider Notes (Signed)
CSN: [id number]     Arrival date & time 11/23/14  0815 History   First MD Initiated Contact with Patient 11/23/14 [phone]     Chief Complaint  Patient presents with  . Hypertension     (Consider location/radiation/quality/duration/timing/severity/associated sxs/prior Treatment) HPI Patient presents with concern of weight gain. She states that over the past 3 days she has gained 12 pounds. No new dyspnea, fatigue, chest pain, nausea, vomiting. Today, after she noticed her weight gain, she took her blood pressure, found to be elevated. She is here for evaluation. She denies a history of renal disease, heart failure, liver disease. She has a noted history of hypertension, for which she is trying to lose weight. No recent medication changes, activity changes, diet changes. Patient is using [employer].  Past Medical History  Diagnosis Date  . Depression    Past Surgical History  Procedure Laterality Date  . Mouth surgery    . Cholecystectomy     No family history on file. History  Substance Use Topics  . Smoking status: Current Every Day Smoker -- 1.00 packs/day  . Smokeless tobacco: Not on file  . Alcohol Use: Yes   OB History    No data available     Review of Systems  Constitutional:       Per HPI, otherwise negative  HENT:       Per HPI, otherwise negative  Respiratory:       Per HPI, otherwise negative  Cardiovascular:       Per HPI, otherwise negative  Gastrointestinal: Negative for vomiting.  Endocrine:       Negative aside from HPI  Genitourinary:       Neg aside from HPI   Musculoskeletal:       Per HPI, otherwise negative  Skin: Negative.   Neurological: Negative for syncope.      Allergies  Codeine  Home Medications   Prior to Admission medications   Medication Sig Start Date End Date Taking? Authorizing Provider  albuterol (PROVENTIL HFA;VENTOLIN HFA) 108 (90 BASE) MCG/ACT inhaler Inhale 2 puffs into the lungs every 6 (six) hours as  needed for wheezing. Patient not taking: Reported on 09/17/2014 07/07/12   Robyn Haber, MD  ALPRAZolam Duanne Moron) 1 MG tablet as needed.  08/29/14   Historical Provider, MD  azithromycin (ZITHROMAX) 500 MG tablet  07/19/14   Historical Provider, MD  benzonatate (TESSALON) 200 MG capsule  07/19/14   Historical Provider, MD  hydrOXYzine (ATARAX/VISTARIL) 25 MG tablet Take 1 tablet (25 mg total) by mouth every 6 (six) hours as needed for anxiety. Patient not taking: Reported on 09/17/2014 09/03/14   Elmarie Shiley, NP  lisinopril (PRINIVIL,ZESTRIL) 10 MG tablet Take 1 tablet (10 mg total) by mouth daily. Patient not taking: Reported on 09/17/2014 09/03/14   Elmarie Shiley, NP  predniSONE (STERAPRED UNI-PAK) 10 MG tablet  07/19/14   Historical Provider, MD  terbinafine (LAMISIL) 250 MG tablet Take 1 tablet (250 mg total) by mouth daily. 10/28/14   Max T Hyatt, DPM  Vilazodone HCl (VIIBRYD) 10 MG TABS Take 1 tablet (10 mg total) by mouth daily. 09/03/14   Elmarie Shiley, NP   BP 129/72 mmHg  Pulse 81  Temp(Src) 98.2 F (36.8 C) (Oral)  Resp 18  Ht 5\' 6"  (1.676 m)  Wt 168 lb (76.204 kg)  BMI 27.13 kg/m2  SpO2 96% Physical Exam  Constitutional: She is oriented to person, place, and time. She appears well-developed and well-nourished. No distress.  HENT:  Head: Normocephalic and atraumatic.  Eyes: Conjunctivae and EOM are normal.  Cardiovascular: Normal rate and regular rhythm.   Pulmonary/Chest: Effort normal and breath sounds normal. No stridor. No respiratory distress.  Abdominal: She exhibits no distension.  Soft, nontender abdomen, though the patient states that she has increased size over the past few days.  Musculoskeletal: She exhibits no edema.  Neurological: She is alert and oriented to person, place, and time. No cranial nerve deficit.  Skin: Skin is warm and dry.  Psychiatric: She has a normal mood and affect.  Nursing note and vitals reviewed.   ED Course  Procedures (including  critical care time) Labs Review Labs Reviewed  CBC WITH DIFFERENTIAL/PLATELET - Abnormal; Notable for the following:    Neutrophils Relative % 40 (*)    Lymphocytes Relative 49 (*)    All other components within normal limits  URINALYSIS, ROUTINE W REFLEX MICROSCOPIC  BASIC METABOLIC PANEL  BRAIN NATRIURETIC PEPTIDE   I reviewed all labs.  Pulse oximetry 100% room air normal  Blood pressure here is 129/72  10:32 AM Patient is in no distress. She checked her weight here, there seems to be a discrepancy between weight here, weight at home. She has no complaint, voices understanding of all results, will follow up with primary care as needed. MDM   Final diagnoses:  Hypertensive urgency   Patient presents with concern of hypertension, weight gain.  Here patient is awake and alert, hemodynamically stable, with no evidence for end organ effects of possible episodic hypertension. There is no evidence for CHF, pneumonia, acute renal or liver failure. Patient's labs are reassuring, vitals are reassuring, she was discharged in stable condition to follow-up with primary care.   Carmin Muskrat, MD 11/23/14 [phone]

## 2014-11-23 NOTE — Discharge Instructions (Signed)
As discussed, your evaluation today has been largely reassuring.  But, it is important that you monitor your condition carefully, and do not hesitate to return to the ED if you develop new, or concerning changes in your condition. ? ?Otherwise, please follow-up with your physician for appropriate ongoing care. ? ?

## 2014-11-23 NOTE — ED Notes (Signed)
Patient states that she had elevated BP this morning (172/97) and states she thinks she gained 12.5 lbs in 3 days, no chest pain, no sob, no sysmptoms

## 2014-11-25 ENCOUNTER — Ambulatory Visit: Payer: 59 | Admitting: Podiatry

## 2015-08-07 ENCOUNTER — Ambulatory Visit (INDEPENDENT_AMBULATORY_CARE_PROVIDER_SITE_OTHER): Payer: 59 | Admitting: Physician Assistant

## 2015-08-07 ENCOUNTER — Emergency Department (HOSPITAL_COMMUNITY): Payer: 59

## 2015-08-07 ENCOUNTER — Emergency Department (HOSPITAL_COMMUNITY)
Admission: EM | Admit: 2015-08-07 | Discharge: 2015-08-07 | Disposition: A | Discharge status: Home / Self Care | Payer: 59 | Attending: Emergency Medicine | Admitting: Emergency Medicine

## 2015-08-07 VITALS — BP 142/84 | HR 74 | Temp 97.8°F | Resp 18

## 2015-08-07 DIAGNOSIS — R059 Cough, unspecified: Secondary | ICD-10-CM

## 2015-08-07 DIAGNOSIS — R062 Wheezing: Secondary | ICD-10-CM | POA: Insufficient documentation

## 2015-08-07 DIAGNOSIS — Z79899 Other long term (current) drug therapy: Secondary | ICD-10-CM | POA: Insufficient documentation

## 2015-08-07 DIAGNOSIS — R05 Cough: Secondary | ICD-10-CM | POA: Diagnosis not present

## 2015-08-07 DIAGNOSIS — Z716 Tobacco abuse counseling: Secondary | ICD-10-CM | POA: Diagnosis not present

## 2015-08-07 DIAGNOSIS — R079 Chest pain, unspecified: Secondary | ICD-10-CM | POA: Diagnosis not present

## 2015-08-07 DIAGNOSIS — Z720 Tobacco use: Secondary | ICD-10-CM | POA: Diagnosis not present

## 2015-08-07 DIAGNOSIS — R0602 Shortness of breath: Secondary | ICD-10-CM

## 2015-08-07 DIAGNOSIS — R0789 Other chest pain: Secondary | ICD-10-CM | POA: Diagnosis not present

## 2015-08-07 DIAGNOSIS — R42 Dizziness and giddiness: Secondary | ICD-10-CM | POA: Insufficient documentation

## 2015-08-07 DIAGNOSIS — F329 Major depressive disorder, single episode, unspecified: Secondary | ICD-10-CM | POA: Diagnosis not present

## 2015-08-07 LAB — CBC WITH DIFFERENTIAL/PLATELET
BASOS ABS: 0 10*3/uL (ref 0.0–0.1)
Basophils Relative: 0 %
EOS ABS: 0.2 10*3/uL (ref 0.0–0.7)
EOS PCT: 2 %
HCT: 39 % (ref 36.0–46.0)
Hemoglobin: 13.3 g/dL (ref 12.0–15.0)
Lymphocytes Relative: 47 %
Lymphs Abs: 3.2 10*3/uL (ref 0.7–4.0)
MCH: 30.4 pg (ref 26.0–34.0)
MCHC: 34.1 g/dL (ref 30.0–36.0)
MCV: 89 fL (ref 78.0–100.0)
Monocytes Absolute: 0.5 10*3/uL (ref 0.1–1.0)
Monocytes Relative: 7 %
Neutro Abs: 3.1 10*3/uL (ref 1.7–7.7)
Neutrophils Relative %: 44 %
PLATELETS: 253 10*3/uL (ref 150–400)
RBC: 4.38 MIL/uL (ref 3.87–5.11)
RDW: 12.7 % (ref 11.5–15.5)
WBC: 7 10*3/uL (ref 4.0–10.5)

## 2015-08-07 LAB — BASIC METABOLIC PANEL
Anion gap: 8 (ref 5–15)
BUN: 15 mg/dL (ref 6–20)
CO2: 23 mmol/L (ref 22–32)
Calcium: 8.9 mg/dL (ref 8.9–10.3)
Chloride: 112 mmol/L — ABNORMAL HIGH (ref 101–111)
Creatinine, Ser: 0.8 mg/dL (ref 0.44–1.00)
GFR calc Af Amer: >60 mL/min (ref >60)
GFR calc non Af Amer: >60 mL/min (ref >60)
GLUCOSE: 87 mg/dL (ref 65–99)
POTASSIUM: 4.1 mmol/L (ref 3.5–5.1)
Sodium: 143 mmol/L (ref 135–145)

## 2015-08-07 LAB — I-STAT TROPONIN, ED
Troponin i, poc: 0 ng/mL (ref 0.00–0.08)
Troponin i, poc: 0 ng/mL (ref 0.00–0.08)

## 2015-08-07 LAB — BRAIN NATRIURETIC PEPTIDE: B NATRIURETIC PEPTIDE 5: 14.1 pg/mL (ref 0.0–100.0)

## 2015-08-07 MED ORDER — GI COCKTAIL ~~LOC~~
30.0000 mL | Freq: Once | ORAL | Status: AC
  Administered 2015-08-07: 30 mL via ORAL
  Filled 2015-08-07: qty 30

## 2015-08-07 MED ORDER — MORPHINE SULFATE (PF) 4 MG/ML IV SOLN
4.0000 mg | Freq: Once | INTRAVENOUS | Status: AC
  Administered 2015-08-07: 4 mg via INTRAVENOUS
  Filled 2015-08-07: qty 1

## 2015-08-07 MED ORDER — NICOTINE 14 MG/24HR TD PT24: 14.0000 mg | MEDICATED_PATCH | Freq: Every day | TRANSDERMAL | Status: DC

## 2015-08-07 MED ORDER — VARENICLINE TARTRATE 0.5 MG X 11 & 1 MG X 42 PO MISC: ORAL | Status: DC

## 2015-08-07 MED ORDER — VARENICLINE TARTRATE 1 MG PO TABS: 1.0000 mg | ORAL_TABLET | Freq: Two times a day (BID) | ORAL | Status: DC

## 2015-08-07 MED ORDER — NITROGLYCERIN 0.4 MG SL SUBL: 0.4000 mg | SUBLINGUAL_TABLET | SUBLINGUAL | Status: DC | PRN

## 2015-08-07 NOTE — ED Notes (Signed)
Pts IV removed and vital signs updated pt awaiting discharge paperwork at bedside.

## 2015-08-07 NOTE — ED Notes (Signed)
Pt A&Ox4, ambulatory at d/c with steady gait, and she states she has all of her belongings with her at discharge. Pot reports she feels much better.

## 2015-08-07 NOTE — ED Notes (Signed)
Patient C/O mid chest pain that began at 0500 this AM. C/O some dyspnea. Patient took her alprazolam this AM but it did not help.  Patient went to [facility] urgent care and was sent TO [facility] ED by ambulance.  324 mg ASA given at [facility] urgent care.

## 2015-08-07 NOTE — ED Notes (Signed)
Pt placed on monitor upon return from radiology. Pt remains monitored by blood pressure, pulse ox, and 5 lead.

## 2015-08-07 NOTE — Progress Notes (Signed)
08/07/2015 at 10:38 AM  Connie Shaw / DOB: 1956/01/31 / MRN: [id number]  The patient has MDD (major depressive disorder), recurrent episode, severe ([place]) and Major depressive disorder, recurrent, severe without psychotic features ([place]) on her problem list.  SUBJECTIVE  Connie Shaw is a 59 y.o. female who complains of "chest tightness" that woke her from sleep this morning. Associates shortness of breath.  Denies diaphoresis, nausea, presyncope.  She has tried Xanax for her symptoms as she has a history of anxiety however this did not help. She does complain of some mild tenderness of the left chest wall, however reports the tightness goes across the entire sternum. Denies any new physical activities.      She is insistent that I send smoking cessation supplies to the pharmacy today.  I have encouraged her to follow through.    She  has a past medical history of Depression.    Medications reviewed and updated by myself where necessary, and exist elsewhere in the encounter.   Connie Shaw is allergic to codeine. She  reports that she has been smoking.  She does not have any smokeless tobacco history on file. She reports that she drinks alcohol. She reports that she does not use illicit drugs. She  has no sexual activity history on file. The patient  has past surgical history that includes Mouth surgery and Cholecystectomy.  Her family history is not on file.  Review of Systems  Constitutional: Negative for fever and chills.  Respiratory: Positive for shortness of breath. Negative for sputum production and wheezing.   Cardiovascular: Positive for chest pain. Negative for palpitations, orthopnea and leg swelling.  Gastrointestinal: Negative for nausea.  Genitourinary: Negative for dysuria.  Musculoskeletal: Negative for myalgias.  Skin: Negative for itching and rash.  Neurological: Negative for dizziness and headaches.    OBJECTIVE  Her  oral temperature is 97.8 F (36.6  C). Her blood pressure is 142/84 and her pulse is 74. Her respiration is 18 and oxygen saturation is 98%.  The patient's body mass index is unknown because there is no weight on file.  Physical Exam  Constitutional: She is oriented to person, place, and time. She appears well-developed and well-nourished. No distress.  HENT:  Right Ear: External ear normal.  Left Ear: External ear normal.  Nose: Nose normal.  Mouth/Throat: No oropharyngeal exudate.  Eyes: Conjunctivae and EOM are normal. Pupils are equal, round, and reactive to light.  Neck: Normal range of motion. No thyromegaly present.  Cardiovascular: Normal rate, regular rhythm, normal heart sounds and intact distal pulses.   Respiratory: Effort normal and breath sounds normal.    GI: Soft. Bowel sounds are normal.  Musculoskeletal: Normal range of motion.       Right lower leg: She exhibits no tenderness.       Left lower leg: She exhibits no tenderness.       Legs: Lymphadenopathy:    She has no cervical adenopathy.  Neurological: She is alert and oriented to person, place, and time. She has normal reflexes. No cranial nerve deficit. Coordination normal.  Skin: Skin is warm and dry. She is not diaphoretic.  Psychiatric: She has a normal mood and affect. Her behavior is normal. Judgment and thought content normal.    No results found for this or any previous visit (from the past 24 hour(s)).  EKG: NSR without hypertrophy or ST segment elevation or T-wave inversion.   ASSESSMENT & PLAN  Connie Shaw was seen today for chest  pain and shortness of breath.  Diagnoses and all orders for this visit:  Chest pain, unspecified chest pain type: Given complaint of chest pain and shortness of breath here today will send to [facility] for further eval despite mild tenderness of chest wall. She has a long history of smoking and untreated hypertension at this time which includes on episode of hypertensive urgency earlier this year. She has no  lipids screening per [facility], and she is in need of an annual phyiscal.  I am obviously concerned for MI and PE at this point, and would feel more comfortable if she were under the care of a ED team. We have given her 81 mg ASA x 4, started an IV in the left hand, and started 2 liters O2 via nasal canula.    Avoiding work up here today to expedite care. Will send chantix and patches to pharmacy per patient's request. I have advised that she return to me within two weeks for an annual physical and medication refills.   -     EKG 12-Lead    The patient was advised to call or come back to clinic if she does not see an improvement in symptoms, or worsens with the above plan.   Philis Fendt, MHS, PA-C [facility] 08/07/2015 10:38 AM

## 2015-08-07 NOTE — ED Notes (Signed)
Patient C/O chest pain that began this AM at 0530.  States that she took her xanax and the pain is easing off.  Patient states that the pain is in her right and left lower chest and that it was pressure.  States that the xanax helped her pain.  Patient has received 4 baby ASA.

## 2015-08-07 NOTE — ED Notes (Signed)
Pt remains monitored by blood pressure and pulse ox.  

## 2015-08-07 NOTE — ED Notes (Signed)
Pt placed onto monitor upon arrival to room. Pt monitored by blood pressure, pulse ox, and 12 lead. Pts EKG given to and signed by Dr. Reather Converse.

## 2015-08-07 NOTE — ED Notes (Signed)
Patient transported to X-ray 

## 2015-08-07 NOTE — ED Provider Notes (Signed)
CSN: [id number]     Arrival date & time 08/07/15  1141 History   First MD Initiated Contact with Patient 08/07/15 1149     Chief Complaint  Patient presents with  . Chest Pain     (Consider location/radiation/quality/duration/timing/severity/associated sxs/prior Treatment) HPI Comments: Connie Shaw is a 59 y.o. female with a PMHx of depression, HTN, and tobacco use, and a PSHx of cholecystectomy, who presents to the ED with complaints of anterior chest pain that awoke her from rest around 5:30 AM. She describes the pain as initially 10/10, but currently 3/10 anterior chest pressure, constant, nonradiating, with no known aggravating factors, unchanged with exertion or inspiration, mildly improved with 0.5 mg of Xanax, and unchanged with 324mg  aspirin given prior to arrival. Associated symptoms include lightheadedness which is currently resolved. Patient states she has had some shortness of breath today, but states that it is the same as her chronic shortness of breath due to her smoking. She reports she smokes 1 pack per day. She has had a dry nonproductive cough for several days, and she has had some wheezing associated with the cough. She has a family history of CHF in both parents, MI in both parents, and she believes her mother had a pulmonary embolism.  She denies any fevers, chills, diaphoresis, hemoptysis, leg swelling, recent travel/surgery/immobilization, estrogen use, active cancer, personal history of DVT/PE, abdominal pain, nausea, vomiting, diarrhea, dysuria, hematuria, numbness, tingling, weakness, claudication, or orthopnea. She went to [place] and was sent here for cardiac evaluation.   Patient is a 59 y.o. female presenting with chest pain. The history is provided by the patient and medical records. No language interpreter was used.  Chest Pain Pain location:  L chest and R chest Pain quality: pressure   Pain radiates to:  Does not radiate Pain radiates to the back: no     Pain severity:  Moderate Onset quality:  Sudden Duration:  6 hours Timing:  Constant Progression:  Improving Chronicity:  New Context: at rest   Relieved by: xanax 0.5mg . Worsened by:  Nothing tried Ineffective treatments:  Aspirin Associated symptoms: cough and shortness of breath (chronic and unchanged)   Associated symptoms: no abdominal pain, no back pain, no claudication, no diaphoresis, no fever, no lower extremity edema, no nausea, no near-syncope, no numbness, no orthopnea, not vomiting and no weakness   Risk factors: hypertension and smoking   Risk factors: no birth control, no diabetes mellitus, no high cholesterol, no immobilization, no prior DVT/PE and no surgery     Past Medical History  Diagnosis Date  . Depression    Past Surgical History  Procedure Laterality Date  . Mouth surgery    . Cholecystectomy     No family history on file. Social History  Substance Use Topics  . Smoking status: Current Every Day Smoker -- 1.00 packs/day  . Smokeless tobacco: Not on file  . Alcohol Use: Yes   OB History    No data available     Review of Systems  Constitutional: Negative for fever, chills and diaphoresis.  Respiratory: Positive for cough, shortness of breath (chronic and unchanged) and wheezing.   Cardiovascular: Positive for chest pain. Negative for orthopnea, claudication, leg swelling and near-syncope.  Gastrointestinal: Negative for nausea, vomiting, abdominal pain and diarrhea.  Genitourinary: Negative for dysuria and hematuria.  Musculoskeletal: Negative for myalgias, back pain and arthralgias.  Skin: Negative for color change.  Allergic/Immunologic: Negative for immunocompromised state.  Neurological: Positive for light-headedness (mild, now resolved).  Negative for weakness and numbness.  Psychiatric/Behavioral: Negative for confusion.   10 Systems reviewed and are negative for acute change except as noted in the HPI.    Allergies  Codeine  Home  Medications   Prior to Admission medications   Medication Sig Start Date End Date Taking? Authorizing Provider  ALPRAZolam Duanne Moron) 1 MG tablet as needed.  08/29/14   Historical Provider, MD  azithromycin (ZITHROMAX) 500 MG tablet  07/19/14   Historical Provider, MD  benzonatate (TESSALON) 200 MG capsule  07/19/14   Historical Provider, MD  nicotine (NICODERM CQ - DOSED IN MG/24 HOURS) 14 mg/24hr patch Place 1 patch (14 mg total) onto the skin daily. 08/07/15   Tereasa Coop, PA-C  predniSONE (STERAPRED UNI-PAK) 10 MG tablet  07/19/14   Historical Provider, MD  varenicline (CHANTIX CONTINUING MONTH PAK) 1 MG tablet Take 1 tablet (1 mg total) by mouth 2 (two) times daily. 08/07/15   Tereasa Coop, PA-C  varenicline (CHANTIX STARTING MONTH PAK) 0.5 MG X 11 & 1 MG X 42 tablet Take 0.5 mg tablet once daily for 3 days, then increase to 0.5 mg tablet twice daily for 4 days, then increase to 1 mg tablet twice daily. 08/07/15   Tereasa Coop, PA-C   BP 151/91 mmHg  Pulse 62  Temp(Src) 97.7 F (36.5 C) (Oral)  Resp 14  Ht 5\' 7"  (1.702 m)  Wt 170 lb (77.111 kg)  BMI 26.62 kg/m2  SpO2 98% Physical Exam  Constitutional: She is oriented to person, place, and time. Vital signs are normal. She appears well-developed and well-nourished.  Non-toxic appearance. No distress.  Afebrile, nontoxic, NAD  HENT:  Head: Normocephalic and atraumatic.  Mouth/Throat: Oropharynx is clear and moist and mucous membranes are normal.  Eyes: Conjunctivae and EOM are normal. Right eye exhibits no discharge. Left eye exhibits no discharge.  Neck: Normal range of motion. Neck supple.  Cardiovascular: Normal rate, regular rhythm, normal heart sounds and intact distal pulses.  Exam reveals no gallop and no friction rub.   No murmur heard. RRR, nl s1/s2, no m/r/g, distal pulses intact, no pedal edema   Pulmonary/Chest: Effort normal and breath sounds normal. No respiratory distress. She has no decreased breath sounds. She  has no wheezes. She has no rhonchi. She has no rales. She exhibits tenderness. She exhibits no crepitus, no deformity and no retraction.    CTAB in all lung fields, no w/r/r, no hypoxia or increased WOB, speaking in full sentences, SpO2 98% on RA  Mild chest wall TTP, no crepitus or deformity, no retractions  Abdominal: Soft. Normal appearance and bowel sounds are normal. She exhibits no distension. There is no tenderness. There is no rigidity, no rebound, no guarding, no CVA tenderness, no tenderness at McBurney's point and negative Murphy's sign.  Musculoskeletal: Normal range of motion.  MAE x4 Strength and sensation grossly intact Distal pulses intact No pedal edema, neg homan's bilaterally   Neurological: She is alert and oriented to person, place, and time. She has normal strength. No sensory deficit.  Skin: Skin is warm, dry and intact. No rash noted.  Psychiatric: She has a normal mood and affect.  Nursing note and vitals reviewed.   ED Course  Procedures (including critical care time) Labs Review Labs Reviewed  BASIC METABOLIC PANEL - Abnormal; Notable for the following:    Chloride 112 (*)    All other components within normal limits  CBC WITH DIFFERENTIAL/PLATELET  BRAIN NATRIURETIC PEPTIDE  I-STAT TROPOININ,  ED  Randolm Idol, ED    Imaging Review Dg Chest 2 View  08/07/2015  CLINICAL DATA:  Shortness of breath, chest pain. EXAM: CHEST  2 VIEW COMPARISON:  July 21, 2009. FINDINGS: The heart size and mediastinal contours are within normal limits. Both lungs are clear. No pneumothorax or pleural effusion is noted. The visualized skeletal structures are unremarkable. IMPRESSION: No active cardiopulmonary disease. Electronically Signed   By: Marijo Conception, M.D.   On: 08/07/2015 13:09     Echo 08/04/2009: Study Conclusions Left ventricle: The cavity size was normal. Wall thickness was normal. Systolic function was normal. The estimated ejection fraction was in  the range of 55% to 60%. Wall motion was normal; there were no regional wall motion abnormalities. Doppler parameters are consistent with abnormal left ventricular relaxation (grade 1 diastolic dysfunction).  I have personally reviewed and evaluated these images and lab results as part of my medical decision-making.   EKG Interpretation   Date/Time:  Friday August 07 2015 11:50:54 EDT Ventricular Rate:  63 PR Interval:  163 QRS Duration: 84 QT Interval:  475 QTC Calculation: 486 R Axis:   78 Text Interpretation:  Sinus rhythm Borderline prolonged QT interval  Baseline wander in lead(s) I III aVL Confirmed by ZAVITZ  MD, JOSHUA  ([id number]) on 08/07/2015 12:14:18 PM      MDM   Final diagnoses:  Atypical chest pain  Cough  Tobacco use  SOB (shortness of breath)    59 y.o. female here with CP that awoke her from sleep, has eased some with xanax. SOB unchanged from her chronic SOB. No pleuritic chest pain, no tachycardia or hypoxia, no LE swelling, doubt PE. On exam, mild chest wall tenderness, could be musculoskeletal. Will get labs, CXR, EKG, and give GI cocktail, morphine, and NTG for pain. Will reassess shortly.   2:24 PM Trop neg at [facility], will repeat at 3hrs post-first in order to r/o. BMP unremarkable. CBC and BNP WNL. CXR clear. Pain improved after GI cocktail and morphine. Will monitor and reassess after next trop.   4:44 PM Second trop neg. Will d/c home with instructions to f/up with PCP in 1wk for follow up. Stay well hydrated, quit smoking. Tums/maalox PRN pain. I explained the diagnosis and have given explicit precautions to return to the ER including for any other new or worsening symptoms. The patient understands and accepts the medical plan as it's been dictated and I have answered their questions. Discharge instructions concerning home care and prescriptions have been given. The patient is STABLE and is discharged to home in good condition.  BP 145/74 mmHg   Pulse 59  Temp(Src) 97.7 F (36.5 C) (Oral)  Resp 16  Ht 5\' 7"  (1.702 m)  Wt 170 lb (77.111 kg)  BMI 26.62 kg/m2  SpO2 97%  Meds ordered this encounter  Medications  . gi cocktail (Maalox,Lidocaine,Donnatal)    Sig:   . morphine 4 MG/ML injection 4 mg    Sig:      Genni Buske Camprubi-Soms, PA-C 08/07/15 1645  Elnora Morrison, MD 08/07/15 1646

## 2015-08-07 NOTE — Discharge Instructions (Signed)
Your work up today was unremarkable. Use tylenol or motrin as needed for pain, and consider over the counter tums or maalox as needed for indigestion/pain. Stay well hydrated. Use mucinex as needed for cough/expectoration. May consider over the counter antihistamines as needed for cough/cold symptoms. Stop smoking as this will likely help your symptoms. Follow up with your primary care doctor at [facility] in 1 week for ongoing management of your symptoms. Return to the ER for changes or worsening symptoms.   Chest Wall Pain Chest wall pain is pain in or around the bones and muscles of your chest. Sometimes, an injury causes this pain. Sometimes, the cause may not be known. This pain may take several weeks or longer to get better. HOME CARE INSTRUCTIONS  Pay attention to any changes in your symptoms. Take these actions to help with your pain:   Rest as told by your health care provider.   Avoid activities that cause pain. These include any activities that use your chest muscles or your abdominal and side muscles to lift heavy items.   If directed, apply ice to the painful area:  Put ice in a plastic bag.  Place a towel between your skin and the bag.  Leave the ice on for 20 minutes, 2-3 times per day.  Take over-the-counter and prescription medicines only as told by your health care provider.  Do not use tobacco products, including cigarettes, chewing tobacco, and e-cigarettes. If you need help quitting, ask your health care provider.  Keep all follow-up visits as told by your health care provider. This is important. SEEK MEDICAL CARE IF:  You have a fever.  Your chest pain becomes worse.  You have new symptoms. SEEK IMMEDIATE MEDICAL CARE IF:  You have nausea or vomiting.  You feel sweaty or light-headed.  You have a cough with phlegm (sputum) or you cough up blood.  You develop shortness of breath.   This information is not intended to replace advice  given to you by your health care provider. Make sure you discuss any questions you have with your health care provider.   Document Released: 09/19/2005 Document Revised: 06/10/2015 Document Reviewed: 12/15/2014 Elsevier Interactive Patient Education 2016 Else[employer] Inc[employer]   Nonspecific Chest Pain It is often hard to find the cause of chest pain. There is always a chance that your pain could be related to something serious, such as a heart attack or a blood clot in your lungs. Chest pain can also be caused by conditions that are not life-threatening. If you have chest pain, it is very important to follow up with your doctor.  HOME CARE  If you were prescribed an antibiotic medicine, finish it all even if you start to feel better.  Avoid any activities that cause chest pain.  Do not use any tobacco products, including cigarettes, chewing tobacco, or electronic cigarettes. If you need help quitting, ask your doctor.  Do not drink alcohol.  Take medicines only as told by your doctor.  Keep all follow-up visits as told by your doctor. This is important. This includes any further testing if your chest pain does not go away.  Your doctor may tell you to keep your head raised (elevated) while you sleep.  Make lifestyle changes as told by your doctor. These may include:  Getting regular exercise. Ask your doctor to suggest some activities that are safe for you.  Eating a heart-healthy diet. Your doctor or a diet specialist (dietitian) can help you  to learn healthy eating options.  Maintaining a healthy weight.  Managing diabetes, if necessary.  Reducing stress. GET HELP IF:  Your chest pain does not go away, even after treatment.  You have a rash with blisters on your chest.  You have a fever. GET HELP RIGHT AWAY IF:  Your chest pain is worse.  You have an increasing cough, or you cough up blood.  You have severe belly (abdominal) pain.  You feel extremely weak.  You pass out  (faint).  You have chills.  You have sudden, unexplained chest discomfort.  You have sudden, unexplained discomfort in your arms, back, neck, or jaw.  You have shortness of breath at any time.  You suddenly start to sweat, or your skin gets clammy.  You feel nauseous.  You vomit.  You suddenly feel light-headed or dizzy.  Your heart begins to beat quickly, or it feels like it is skipping beats. These symptoms may be an emergency. Do not wait to see if the symptoms will go away. Get medical help right away. Call your local emergency services (911 in the [place] Do not drive yourself to the hospital.   This information is not intended to replace advice given to you by your health care provider. Make sure you discuss any questions you have with your health care provider.   Document Released: 03/07/2008 Document Revised: 10/10/2014 Document Reviewed: 04/25/2014 Elsevier Interactive Patient Education 2016 Elsevier Inc. Cough, Adult A cough helps to clear your throat and lungs. A cough may last only 2-3 weeks (acute), or it may last longer than 8 weeks (chronic). Many different things can cause a cough. A cough may be a sign of an illness or another medical condition. HOME CARE  Pay attention to any changes in your cough.  Take medicines only as told by your doctor.  If you were prescribed an antibiotic medicine, take it as told by your doctor. Do not stop taking it even if you start to feel better.  Talk with your doctor before you try using a cough medicine.  Drink enough fluid to keep your pee (urine) clear or pale yellow.  If the air is dry, use a cold steam vaporizer or humidifier in your home.  Stay away from things that make you cough at work or at home.  If your cough is worse at night, try using extra pillows to raise your head up higher while you sleep.  Do not smoke, and try not to be around smoke. If you need help quitting, ask your doctor.  Do not have  caffeine.  Do not drink alcohol.  Rest as needed. GET HELP IF:  You have new problems (symptoms).  You cough up yellow fluid (pus).  Your cough does not get better after 2-3 weeks, or your cough gets worse.  Medicine does not help your cough and you are not sleeping well.  You have pain that gets worse or pain that is not helped with medicine.  You have a fever.  You are losing weight and you do not know why.  You have night sweats. GET HELP RIGHT AWAY IF:  You cough up blood.  You have trouble breathing.  Your heartbeat is very fast.   This information is not intended to replace advice given to you by your health care provider. Make sure you discuss any questions you have with your health care provider.   Document Released: 06/02/2011 Document Revised: 06/10/2015 Document Reviewed: 11/26/2014 Elsevier Interactive Patient Education 2016  [employer].  Smoking Cessation, Tips for Success If you are ready to quit smoking, congratulations! You have chosen to help yourself be healthier. Cigarettes bring nicotine, tar, carbon monoxide, and other irritants into your body. Your lungs, heart, and blood vessels will be able to work better without these poisons. There are many different ways to quit smoking. Nicotine gum, nicotine patches, a nicotine inhaler, or nicotine nasal spray can help with physical craving. Hypnosis, support groups, and medicines help break the habit of smoking. WHAT THINGS CAN I DO TO MAKE QUITTING EASIER?  Here are some tips to help you quit for good:  Pick a date when you will quit smoking completely. Tell all of your friends and family about your plan to quit on that date.  Do not try to slowly cut down on the number of cigarettes you are smoking. Pick a quit date and quit smoking completely starting on that day.  Throw away all cigarettes.   Clean and remove all ashtrays from your home, work, and car.  On a card, write down your reasons for  quitting. Carry the card with you and read it when you get the urge to smoke.  Cleanse your body of nicotine. Drink enough water and fluids to keep your urine clear or pale yellow. Do this after quitting to flush the nicotine from your body.  Learn to predict your moods. Do not let a bad situation be your excuse to have a cigarette. Some situations in your life might tempt you into wanting a cigarette.  Never have "just one" cigarette. It leads to wanting another and another. Remind yourself of your decision to quit.  Change habits associated with smoking. If you smoked while driving or when feeling stressed, try other activities to replace smoking. Stand up when drinking your coffee. Brush your teeth after eating. Sit in a different chair when you read the paper. Avoid alcohol while trying to quit, and try to drink fewer caffeinated beverages. Alcohol and caffeine may urge you to smoke.  Avoid foods and drinks that can trigger a desire to smoke, such as sugary or spicy foods and alcohol.  Ask people who smoke not to smoke around you.  Have something planned to do right after eating or having a cup of coffee. For example, plan to take a walk or exercise.  Try a relaxation exercise to calm you down and decrease your stress. Remember, you may be tense and nervous for the first 2 weeks after you quit, but this will pass.  Find new activities to keep your hands busy. Play with a pen, coin, or rubber band. Doodle or draw things on paper.  Brush your teeth right after eating. This will help cut down on the craving for the taste of tobacco after meals. You can also try mouthwash.   Use oral substitutes in place of cigarettes. Try using lemon drops, carrots, cinnamon sticks, or chewing gum. Keep them handy so they are available when you have the urge to smoke.  When you have the urge to smoke, try deep breathing.  Designate your home as a nonsmoking area.  If you are a heavy smoker, ask your  health care provider about a prescription for nicotine chewing gum. It can ease your withdrawal from nicotine.  Reward yourself. Set aside the cigarette money you save and buy yourself something nice.  Look for support from others. Join a support group or smoking cessation program. Ask someone at home or at work to help  you with your plan to quit smoking.  Always ask yourself, "Do I need this cigarette or is this just a reflex?" Tell yourself, "Today, I choose not to smoke," or "I do not want to smoke." You are reminding yourself of your decision to quit.  Do not replace cigarette smoking with electronic cigarettes (commonly called e-cigarettes). The safety of e-cigarettes is unknown, and some may contain harmful chemicals.  If you relapse, do not give up! Plan ahead and think about what you will do the next time you get the urge to smoke. HOW WILL I FEEL WHEN I QUIT SMOKING? You may have symptoms of withdrawal because your body is used to nicotine (the addictive substance in cigarettes). You may crave cigarettes, be irritable, feel very hungry, cough often, get headaches, or have difficulty concentrating. The withdrawal symptoms are only temporary. They are strongest when you first quit but will go away within 10-14 days. When withdrawal symptoms occur, stay in control. Think about your reasons for quitting. Remind yourself that these are signs that your body is healing and getting used to being without cigarettes. Remember that withdrawal symptoms are easier to treat than the major diseases that smoking can cause.  Even after the withdrawal is over, expect periodic urges to smoke. However, these cravings are generally short lived and will go away whether you smoke or not. Do not smoke! WHAT RESOURCES ARE AVAILABLE TO HELP ME QUIT SMOKING? Your health care provider can direct you to community resources or hospitals for support, which may include:  Group  support.  Education.  Hypnosis.  Therapy.   This information is not intended to replace advice given to you by your health care provider. Make sure you discuss any questions you have with your health care provider.   Document Released: 06/17/2004 Document Revised: 10/10/2014 Document Reviewed: 03/07/2013 Elsevier Interactive Patient Education 2016 [employer].  Shortness of Breath Shortness of breath means you have trouble breathing. Shortness of breath needs medical care right away. HOME CARE   Do not smoke.  Avoid being around chemicals or things (paint fumes, dust) that may bother your breathing.  Rest as needed. Slowly begin your normal activities.  Only take medicines as told by your doctor.  Keep all doctor visits as told. GET HELP RIGHT AWAY IF:   Your shortness of breath gets worse.  You feel lightheaded, pass out (faint), or have a cough that is not helped by medicine.  You cough up blood.  You have pain with breathing.  You have pain in your chest, arms, shoulders, or belly (abdomen).  You have a fever.  You cannot walk up stairs or exercise the way you normally do.  You do not get better in the time expected.  You have a hard time doing normal activities even with rest.  You have problems with your medicines.  You have any new symptoms. MAKE SURE YOU:  Understand these instructions.  Will watch your condition.  Will get help right away if you are not doing well or get worse.   This information is not intended to replace advice given to you by your health care provider. Make sure you discuss any questions you have with your health care provider.   Document Released: 03/07/2008 Document Revised: 09/24/2013 Document Reviewed: 12/05/2011 Elsevier Interactive Patient Education [employer].

## 2015-08-13 ENCOUNTER — Encounter: Payer: Self-pay | Admitting: Physician Assistant

## 2015-08-13 ENCOUNTER — Ambulatory Visit (INDEPENDENT_AMBULATORY_CARE_PROVIDER_SITE_OTHER): Payer: 59 | Admitting: Physician Assistant

## 2015-08-13 VITALS — BP 122/76 | HR 85 | Temp 98.4°F | Resp 18 | Ht 66.0 in | Wt 173.8 lb

## 2015-08-13 DIAGNOSIS — Z139 Encounter for screening, unspecified: Secondary | ICD-10-CM

## 2015-08-13 DIAGNOSIS — Z0000 Encounter for general adult medical examination without abnormal findings: Secondary | ICD-10-CM | POA: Diagnosis not present

## 2015-08-13 NOTE — Progress Notes (Addendum)
08/15/2015 at 9:55 PM  Connie Shaw / DOB: [birth year] / MRN: [id number]  The patient has MDD (major depressive disorder), recurrent episode, severe ([place]); Smoker unmotivated to quit; and Stress at home on her problem list.  SUBJECTIVE  Connie Shaw is a 59 y.o. female who complains of need for annual physical.  She has a long history of depression and is under the care of a psychiatrist and psychologist for this problem.  She receives her pap and mammograms locally and will bring those results to [place] at her next follow up.  She is due back to see Dr. Collene Mares for a colonoscopy and is going to call his office in roughly 1 week. She has never been screened for diabetes and her lipids have never been measured per [facility].  She declines the flu and tetanus shot today.   She  has a past medical history of Depression.    Depression screen PHQ 2/9 08/13/2015  Decreased Interest 0  Down, Depressed, Hopeless 3  PHQ - 2 Score 3  Altered sleeping 3  Tired, decreased energy 3  Change in appetite 3  Feeling bad or failure about yourself  3  Trouble concentrating 3  Moving slowly or fidgety/restless 0  Suicidal thoughts 0  PHQ-9 Score 18  Difficult doing work/chores Very difficult   Medications reviewed and updated by myself where necessary, and exist elsewhere in the encounter.   Connie Shaw is allergic to codeine. She  reports that she has been smoking.  She does not have any smokeless tobacco history on file. She reports that she drinks alcohol. She reports that she does not use illicit drugs. She  has no sexual activity history on file. The patient  has past surgical history that includes Mouth surgery and Cholecystectomy.  Her family history includes Congestive Heart Failure in her mother; Diabetes in her father; Heart disease in her mother; Scoliosis in her daughter; Stroke in her father.  Review of Systems  Constitutional: Negative for fever and chills.  Respiratory: Negative for  shortness of breath.   Cardiovascular: Negative for chest pain.  Gastrointestinal: Negative for nausea and abdominal pain.  Genitourinary: Negative.   Skin: Negative for rash.  Neurological: Negative for dizziness and headaches.    OBJECTIVE  BP 122/76 mmHg  Pulse 85  Temp(Src) 98.4 F (36.9 C) (Oral)  Resp 18  Ht _0  (1.676 m)  Wt 173 lb 12.8 oz (78.835 kg)  BMI 28.07 kg/m2  SpO2 95%  Estimated Creatinine Clearance: 71.1 mL/min (by C-G formula based on Cr of 0.88).   Physical Exam  Constitutional: She is oriented to person, place, and time. She appears well-developed and well-nourished. No distress.  HENT:  Right Ear: External ear normal.  Left Ear: External ear normal.  Nose: Nose normal.  Mouth/Throat: No oropharyngeal exudate.  Eyes: Conjunctivae and EOM are normal. Pupils are equal, round, and reactive to light.  Neck: Normal range of motion. No thyromegaly present.  Cardiovascular: Normal rate, regular rhythm, normal heart sounds and intact distal pulses.   Respiratory: Effort normal and breath sounds normal.  GI: Soft. Bowel sounds are normal.  Musculoskeletal: Normal range of motion.  Lymphadenopathy:    She has no cervical adenopathy.  Neurological: She is alert and oriented to person, place, and time. She has normal reflexes. No cranial nerve deficit. Coordination normal.  Skin: Skin is warm and dry. She is not diaphoretic.  Psychiatric: She has a normal mood and affect. Her behavior is normal.  Judgment and thought content normal.   Results for orders placed or performed in visit on 08/13/15  TSH  Result Value Ref Range   TSH 0.843 0.350 - 4.500 uIU/mL  CBC with Differential/Platelet  Result Value Ref Range   WBC 8.5 4.0 - 10.5 K/uL   RBC 4.86 3.87 - 5.11 MIL/uL   Hemoglobin 14.9 12.0 - 15.0 g/dL   HCT 42.9 36.0 - 46.0 %   MCV 88.3 78.0 - 100.0 fL   MCH 30.7 26.0 - 34.0 pg   MCHC 34.7 30.0 - 36.0 g/dL   RDW 13.2 11.5 - 15.5 %   Platelets 323 150 - 400  K/uL   MPV 10.2 8.6 - 12.4 fL   Neutrophils Relative % 52 43 - 77 %   Neutro Abs 4.4 1.7 - 7.7 K/uL   Lymphocytes Relative 39 12 - 46 %   Lymphs Abs 3.3 0.7 - 4.0 K/uL   Monocytes Relative 7 3 - 12 %   Monocytes Absolute 0.6 0.1 - 1.0 K/uL   Eosinophils Relative 2 0 - 5 %   Eosinophils Absolute 0.2 0.0 - 0.7 K/uL   Basophils Relative 0 0 - 1 %   Basophils Absolute 0.0 0.0 - 0.1 K/uL   Smear Review Criteria for review not met   COMPLETE METABOLIC PANEL WITH GFR  Result Value Ref Range   Sodium 141 135 - 146 mmol/L   Potassium 4.3 3.5 - 5.3 mmol/L   Chloride 105 98 - 110 mmol/L   CO2 22 20 - 31 mmol/L   Glucose, Bld 85 65 - 99 mg/dL   BUN 12 7 - 25 mg/dL   Creat 0.88 0.50 - 0.99 mg/dL   Total Bilirubin 0.5 0.2 - 1.2 mg/dL   Alkaline Phosphatase 63 33 - 130 U/L   AST 18 10 - 35 U/L   ALT 12 6 - 29 U/L   Total Protein 7.1 6.1 - 8.1 g/dL   Albumin 4.4 3.6 - 5.1 g/dL   Calcium 9.4 8.6 - 10.4 mg/dL   GFR, Est African American 82 >=60 mL/min   GFR, Est Non African American 71 >=60 mL/min  Lipid panel  Result Value Ref Range   Cholesterol 198 125 - 200 mg/dL   Triglycerides 117 <150 mg/dL   HDL 62 >=46 mg/dL   Total CHOL/HDL Ratio 3.2 <=5.0 Ratio   VLDL 23 <30 mg/dL   LDL Cholesterol 113 <130 mg/dL  Vitamin D 1,25 dihydroxy  Result Value Ref Range   Vitamin D 1, 25 (OH)2 Total     Vitamin D3 1, 25 (OH)2     Vitamin D2 1, 25 (OH)2    HIV antibody  Result Value Ref Range   HIV 1&2 Ab, 4th Generation NONREACTIVE NONREACTIVE  Hepatitis C antibody  Result Value Ref Range   HCV Ab NEGATIVE NEGATIVE  Hemoglobin A1c  Result Value Ref Range   Hgb A1c MFr Bld 5.0 <5.7 %   Mean Plasma Glucose 97 <117 mg/dL   ASCVD: 6.3 ten year risk, not a candidate for statin therapy  ASSESSMENT & PLAN  Connie Shaw was seen today for follow-up.  Diagnoses and all orders for this visit:   Annual physical exam: Work up excellent thus far.  Follow up in 6 months to a year.  I have strongly  encouraged her to quit smoking and to pick up the medications prescribed for this.  She is to call Dr. Lorie Apley office for a repeat colonoscopy in one week and will  send those results to [facility].  Advised that she also send [place] documentation of her PAPs and mammograms.  Depression managed by specialist office.   Screening -     TSH -     POCT glycosylated hemoglobin (Hb A1C) -     CBC with Differential/Platelet -     COMPLETE METABOLIC PANEL WITH GFR -     Lipid panel -     Vitamin D 1,25 dihydroxy -     HIV antibody -     Hepatitis C antibody  Philis Fendt, MHS, PA-C [facility] 08/15/2015 9:55 PM

## 2015-08-14 LAB — CBC WITH DIFFERENTIAL/PLATELET
Basophils Absolute: 0 10*3/uL (ref 0.0–0.1)
Basophils Relative: 0 % (ref 0–1)
Eosinophils Absolute: 0.2 10*3/uL (ref 0.0–0.7)
Eosinophils Relative: 2 % (ref 0–5)
HEMATOCRIT: 42.9 % (ref 36.0–46.0)
HEMOGLOBIN: 14.9 g/dL (ref 12.0–15.0)
LYMPHS PCT: 39 % (ref 12–46)
Lymphs Abs: 3.3 10*3/uL (ref 0.7–4.0)
MCH: 30.7 pg (ref 26.0–34.0)
MCHC: 34.7 g/dL (ref 30.0–36.0)
MCV: 88.3 fL (ref 78.0–100.0)
MONO ABS: 0.6 10*3/uL (ref 0.1–1.0)
MONOS PCT: 7 % (ref 3–12)
MPV: 10.2 fL (ref 8.6–12.4)
NEUTROS ABS: 4.4 10*3/uL (ref 1.7–7.7)
Neutrophils Relative %: 52 % (ref 43–77)
Platelets: 323 10*3/uL (ref 150–400)
RBC: 4.86 MIL/uL (ref 3.87–5.11)
RDW: 13.2 % (ref 11.5–15.5)
WBC: 8.5 10*3/uL (ref 4.0–10.5)

## 2015-08-14 LAB — COMPLETE METABOLIC PANEL WITH GFR
ALBUMIN: 4.4 g/dL (ref 3.6–5.1)
ALT: 12 U/L (ref 6–29)
AST: 18 U/L (ref 10–35)
Alkaline Phosphatase: 63 U/L (ref 33–130)
BILIRUBIN TOTAL: 0.5 mg/dL (ref 0.2–1.2)
BUN: 12 mg/dL (ref 7–25)
CALCIUM: 9.4 mg/dL (ref 8.6–10.4)
CO2: 22 mmol/L (ref 20–31)
Chloride: 105 mmol/L (ref 98–110)
Creat: 0.88 mg/dL (ref 0.50–0.99)
GFR, EST NON AFRICAN AMERICAN: 71 mL/min (ref >=60)
GFR, Est African American: 82 mL/min (ref >=60)
Glucose, Bld: 85 mg/dL (ref 65–99)
POTASSIUM: 4.3 mmol/L (ref 3.5–5.3)
Sodium: 141 mmol/L (ref 135–146)
TOTAL PROTEIN: 7.1 g/dL (ref 6.1–8.1)

## 2015-08-14 LAB — LIPID PANEL
Cholesterol: 198 mg/dL (ref 125–200)
HDL: 62 mg/dL (ref >=46)
LDL CALC: 113 mg/dL (ref <130)
TRIGLYCERIDES: 117 mg/dL (ref <150)
Total CHOL/HDL Ratio: 3.2 Ratio (ref <=5.0)
VLDL: 23 mg/dL (ref <30)

## 2015-08-14 LAB — HEMOGLOBIN A1C
Hgb A1c MFr Bld: 5 % (ref <5.7)
MEAN PLASMA GLUCOSE: 97 mg/dL (ref <117)

## 2015-08-14 LAB — TSH: TSH: 0.843 u[IU]/mL (ref 0.350–4.500)

## 2015-08-14 LAB — HEPATITIS C ANTIBODY: HCV Ab: NEGATIVE

## 2015-08-14 LAB — HIV ANTIBODY (ROUTINE TESTING W REFLEX): HIV 1&2 Ab, 4th Generation: NONREACTIVE

## 2015-08-15 DIAGNOSIS — F17200 Nicotine dependence, unspecified, uncomplicated: Secondary | ICD-10-CM | POA: Insufficient documentation

## 2015-08-15 DIAGNOSIS — F439 Reaction to severe stress, unspecified: Secondary | ICD-10-CM | POA: Insufficient documentation

## 2015-08-17 LAB — VITAMIN D 1,25 DIHYDROXY
VITAMIN D 1, 25 (OH) TOTAL: 36 pg/mL (ref 18–72)
Vitamin D3 1, 25 (OH)2: 36 pg/mL

## 2015-09-02 ENCOUNTER — Ambulatory Visit (INDEPENDENT_AMBULATORY_CARE_PROVIDER_SITE_OTHER): Payer: 59 | Admitting: Physician Assistant

## 2015-09-02 ENCOUNTER — Encounter: Payer: Self-pay | Admitting: Physician Assistant

## 2015-09-02 VITALS — BP 100/66 | HR 80 | Temp 98.4°F | Resp 16 | Ht 66.0 in | Wt 177.0 lb

## 2015-09-02 DIAGNOSIS — H65192 Other acute nonsuppurative otitis media, left ear: Secondary | ICD-10-CM | POA: Diagnosis not present

## 2015-09-02 DIAGNOSIS — J069 Acute upper respiratory infection, unspecified: Secondary | ICD-10-CM

## 2015-09-02 DIAGNOSIS — B9789 Other viral agents as the cause of diseases classified elsewhere: Secondary | ICD-10-CM

## 2015-09-02 DIAGNOSIS — B070 Plantar wart: Secondary | ICD-10-CM

## 2015-09-02 MED ORDER — CETIRIZINE-PSEUDOEPHEDRINE ER 5-120 MG PO TB12: 1.0000 | ORAL_TABLET | ORAL | Status: AC

## 2015-09-02 MED ORDER — AMOXICILLIN 875 MG PO TABS: 875.0000 mg | ORAL_TABLET | Freq: Two times a day (BID) | ORAL | Status: DC

## 2015-09-02 MED ORDER — IBUPROFEN 600 MG PO TABS: 600.0000 mg | ORAL_TABLET | Freq: Three times a day (TID) | ORAL | Status: DC | PRN

## 2015-09-02 NOTE — Progress Notes (Signed)
09/02/2015 5:00 PM   DOB: 05-04-1956 / MRN: [id number]  SUBJECTIVE:  Connie Shaw is a 59 y.o. female presenting for for the evaluation of cough that started 7 days ago.  Associated symptoms include left sided ear pain, runny nose, congestion and sore throat today and she denies fever, difficulty breathing and jaw pain. Treatments tried thus far include OTC could medication with good  relief. She reports sick contacts.  Complains of a wart on the bottom of her left foot and states "it feels like a nail in my heal."  Has been to 2 podiatrist and 1 dermatologist and treatments have failed.  She has never tried cryotherapy.     She is allergic to codeine.   She  has a past medical history of Depression.    She  reports that she has been smoking.  She does not have any smokeless tobacco history on file. She reports that she drinks alcohol. She reports that she does not use illicit drugs. She  has no sexual activity history on file. The patient  has past surgical history that includes Mouth surgery and Cholecystectomy.  Her family history includes Congestive Heart Failure in her mother; Diabetes in her father; Heart disease in her mother; Scoliosis in her daughter; Stroke in her father.  Review of Systems  Constitutional: Negative for fever and chills.  HENT: Positive for congestion, ear pain and sore throat. Negative for ear discharge.   Respiratory: Positive for cough. Negative for hemoptysis, sputum production, shortness of breath, wheezing and stridor.   Cardiovascular: Negative for chest pain.  Gastrointestinal: Negative for nausea.  Musculoskeletal: Negative for myalgias.  Skin: Positive for rash.  Neurological: Negative for dizziness and headaches.    Problem list and medications reviewed and updated by myself where necessary, and exist elsewhere in the encounter.   OBJECTIVE:  BP 100/66 mmHg  Pulse 80  Temp(Src) 98.4 F (36.9 C) (Oral)  Resp 16  Ht 5\' 6"  (1.676 m)  Wt  177 lb (80.287 kg)  BMI 28.58 kg/m2  SpO2 97% Estimated Creatinine Clearance: 71.7 mL/min (by C-G formula based on Cr of 0.88).  Physical Exam  Constitutional: She is oriented to person, place, and time. She appears well-developed and well-nourished. No distress.  HENT:  Right Ear: Hearing and tympanic membrane normal. No drainage or tenderness. Tympanic membrane is not erythematous and not bulging. No middle ear effusion. No decreased hearing is noted.  Left Ear: Hearing normal. No drainage or tenderness. Tympanic membrane is erythematous and bulging. A middle ear effusion is present. No decreased hearing is noted.  Nose: Mucosal edema present.  Mouth/Throat: Uvula is midline, oropharynx is clear and moist and mucous membranes are normal.  Cardiovascular: Normal rate, regular rhythm and normal heart sounds.   Pulmonary/Chest: Effort normal and breath sounds normal.  Musculoskeletal:       Feet:  Neurological: She is alert and oriented to person, place, and time. No cranial nerve deficit.  Skin: Skin is warm and dry. She is not diaphoretic.   Procedures: Left heal papule trimmed down using a 15 blade and skin curette.  Thrombosed venous capillaries observed.  Liquid nitrogen used for cryotherapy x 6. Patient tolerated without complaint.       No results found for this or any previous visit (from the past 48 hour(s)).  ASSESSMENT AND PLAN  Connie Shaw was seen today for cough, sore throat and sinusitis.  Diagnoses and all orders for this visit:  Acute nonsuppurative otitis media of left  ear: Likely secondary to problem 3.   -     amoxicillin (AMOXIL) 875 MG tablet; Take 1 tablet (875 mg total) by mouth 2 (two) times daily.  Plantar wart: Advised she return in one month if she remains at all symptomatic.  The wart is large and deep, and despite spending 20 minutes on it I doubt one treatment will suffice.   -     PR DESTRUCTION BENIGN LESIONS UP TO 14  Viral URI with cough -      ibuprofen (ADVIL,MOTRIN) 600 MG tablet; Take 1 tablet (600 mg total) by mouth every 8 (eight) hours as needed. -     cetirizine-pseudoephedrine (ZYRTEC-D ALLERGY & CONGESTION) 5-120 MG tablet; Take 1 tablet by mouth every morning. Do not use OTC cold remedies with this medication.    The patient was advised to call or return to clinic if she does not see an improvement in symptoms or to seek the care of the closest emergency department if she worsens with the above plan.   Philis Fendt, MHS, PA-C [facility] 09/02/2015 5:00 PM

## 2015-09-02 NOTE — Progress Notes (Deleted)
Headache; Cyclical vomiting; not intractable; without status migrainosus

## 2015-09-28 ENCOUNTER — Telehealth: Payer: Self-pay | Admitting: Family Medicine

## 2015-09-28 NOTE — Telephone Encounter (Signed)
lmom of nex appt date 02-17-16 @4  to see Renae Fickle

## 2015-10-22 ENCOUNTER — Ambulatory Visit (INDEPENDENT_AMBULATORY_CARE_PROVIDER_SITE_OTHER): Payer: 59 | Admitting: Family Medicine

## 2015-10-22 VITALS — BP 110/68 | HR 80 | Temp 98.2°F | Resp 20 | Ht 65.75 in | Wt 182.8 lb

## 2015-10-22 DIAGNOSIS — B351 Tinea unguium: Secondary | ICD-10-CM

## 2015-10-22 DIAGNOSIS — B070 Plantar wart: Secondary | ICD-10-CM | POA: Diagnosis not present

## 2015-10-22 MED ORDER — EFINACONAZOLE 10 % EX SOLN: CUTANEOUS | Status: DC

## 2015-10-22 NOTE — Progress Notes (Signed)
Discussed the patient's care with Philis Fendt, PA-C. The patient has a very onychomycotic large toenail. That is being removed tonight. We discussed further treatment with antifungal to try and prevent recurrence. The option of topical treatment versus terbinafine will be discussed with the patient. Since the thick fungal nail will be gone, I think a short-term course of terbinafine might benefit her in reducing the recurrence, though I have no official reference on that. The patient will be given the option of treatment. [name] will manage this.  Posey Boyer M.D.

## 2015-10-22 NOTE — Progress Notes (Signed)
10/22/2015 7:09 PM   DOB: 10-25-1955 / MRN: [id number]  SUBJECTIVE:  Connie Shaw is a 60 y.o. female presenting for a recheck of a plantar wart.  She was seen by me for initial treatment on 09/02/15 and reports the wart has been doing well.  Over the last two weeks has noticed some black tissue coming out where the the wart was.  She reports some pain and localized tenderness of this tissue.  Denies fever, chills, nausea.  Is able to walk however this is mildly painful.    Reports she has had toenail fungus for roughly 1 year now and would like to have the toe nail removed tonight.  She is allergic to codeine.   She  has a past medical history of Depression.    She  reports that she has been smoking.  She does not have any smokeless tobacco history on file. She reports that she drinks alcohol. She reports that she does not use illicit drugs. She  has no sexual activity history on file. The patient  has past surgical history that includes Mouth surgery and Cholecystectomy.  Her family history includes Congestive Heart Failure in her mother; Diabetes in her father; Heart disease in her mother; Scoliosis in her daughter; Stroke in her father.  Review of Systems  Constitutional: Negative for fever and chills.  Eyes: Negative for blurred vision.  Respiratory: Negative for cough and shortness of breath.   Cardiovascular: Negative for chest pain.  Gastrointestinal: Negative for nausea and abdominal pain.  Genitourinary: Negative for dysuria, urgency and frequency.  Musculoskeletal: Negative for myalgias.  Skin: Negative for rash.  Neurological: Negative for dizziness, tingling and headaches.  Psychiatric/Behavioral: Negative for depression. The patient is not nervous/anxious.     Problem list and medications reviewed and updated by myself where necessary, and exist elsewhere in the encounter.   OBJECTIVE:  BP 110/68 mmHg  Pulse 80  Temp(Src) 98.2 F (36.8 C) (Oral)  Resp 20  Ht  5' 5.75" (1.67 m)  Wt 182 lb 12.8 oz (82.918 kg)  BMI 29.73 kg/m2  SpO2 98%  Physical Exam  Constitutional: She is oriented to person, place, and time. She appears well-developed.  Eyes: EOM are normal. Pupils are equal, round, and reactive to light.  Cardiovascular: Normal rate.   Pulmonary/Chest: Effort normal.  Abdominal: She exhibits no distension.  Musculoskeletal: Normal range of motion.  Neurological: She is alert and oriented to person, place, and time. No cranial nerve deficit.  Skin: Skin is warm and dry. She is not diaphoretic.  Psychiatric: She has a normal mood and affect.  Vitals reviewed.  Procedure: Wart necrotic tissue removed.  Cryotherapy applied x 3.    Patient anesthetized with 2% lidocaine via digital nerve block.  Right great toe nail lifted away with nail lifter and removed with curved hemostats.  No complications. Xeroform gauze applied.    No results found for this or any previous visit (from the past 72 hour(s)).  ASSESSMENT AND PLAN  Connie Shaw was seen today for follow-up.  Diagnoses and all orders for this visit:  Plantar wart:  Patient with necrotic warty tissue removed.  Crythotherpy applied x 3 to ensure good outcome.  Tinea unguium: Nail removed.  Will start her on Jublia in roughly 2 weeks to a month.  -     Efinaconazole (JUBLIA) 10 % SOLN; Use as directed on the package.  Start in 2 weeks from today.    The patient was advised to call  or return to clinic if she does not see an improvement in symptoms or to seek the care of the closest emergency department if she worsens with the above plan.   Philis Fendt, MHS, PA-C [facility] 10/22/2015 7:09 PM

## 2016-02-11 ENCOUNTER — Ambulatory Visit: Payer: 59 | Admitting: Physician Assistant

## 2016-02-17 ENCOUNTER — Ambulatory Visit: Payer: 59 | Admitting: Physician Assistant

## 2016-11-10 ENCOUNTER — Telehealth: Payer: Self-pay

## 2016-11-10 NOTE — Telephone Encounter (Signed)
Pt advised she needs an ov for this since it is not a screening

## 2016-11-10 NOTE — Telephone Encounter (Signed)
Pt went for her bone density and mammogram however pt was having pain therefore solice needed a diagnostic order for the mammogram which Loletta Specter would need to put in  please advise:  [phone]

## 2016-12-07 ENCOUNTER — Ambulatory Visit (INDEPENDENT_AMBULATORY_CARE_PROVIDER_SITE_OTHER): Payer: 59 | Admitting: Physician Assistant

## 2016-12-07 VITALS — BP 138/80 | HR 76 | Temp 97.7°F | Ht 65.75 in | Wt 189.6 lb

## 2016-12-07 DIAGNOSIS — R059 Cough, unspecified: Secondary | ICD-10-CM

## 2016-12-07 DIAGNOSIS — J9801 Acute bronchospasm: Secondary | ICD-10-CM | POA: Diagnosis not present

## 2016-12-07 DIAGNOSIS — J988 Other specified respiratory disorders: Secondary | ICD-10-CM | POA: Diagnosis not present

## 2016-12-07 DIAGNOSIS — R05 Cough: Secondary | ICD-10-CM

## 2016-12-07 MED ORDER — BENZONATATE 100 MG PO CAPS: 100.0000 mg | ORAL_CAPSULE | Freq: Three times a day (TID) | ORAL | 0 refills | Status: DC | PRN

## 2016-12-07 MED ORDER — GUAIFENESIN ER 1200 MG PO TB12: 1.0000 | ORAL_TABLET | Freq: Two times a day (BID) | ORAL | 1 refills | Status: DC | PRN

## 2016-12-07 MED ORDER — PROMETHAZINE-DM 6.25-15 MG/5ML PO SYRP: 5.0000 mL | ORAL_SOLUTION | Freq: Every evening | ORAL | 0 refills | Status: DC | PRN

## 2016-12-07 MED ORDER — ALBUTEROL SULFATE HFA 108 (90 BASE) MCG/ACT IN AERS: 2.0000 | INHALATION_SPRAY | RESPIRATORY_TRACT | 1 refills | Status: DC | PRN

## 2016-12-07 MED ORDER — PREDNISONE 20 MG PO TABS: ORAL_TABLET | ORAL | 0 refills | Status: DC

## 2016-12-07 NOTE — Progress Notes (Signed)
[facility] [address] [place] [address] [phone]  Date:  12/07/2016   Name:  Connie Shaw   DOB:  05-Feb-1956   MRN:  [id number]  PCP:  Kathlen Brunswick, PA-C    History of Present Illness:  Connie Shaw is a 61 y.o. female patient who presents to [facility] for cc of cough and bodyaches.  3 days ago, she started feeling malaise, and progressivley worsened.  She has hoarseness.  Mild sore throat secondary to coughing.  Nasal congestion and right ear pain.  No face pain.  No fever.  She does have some body aches.  No sob or dyspnea.  She has taken ibuprofen or aleve for her symptoms.  Very little hydration.  No known sick contacts. Right sided pain after the coughing. Smokes 1ppd  Patient Active Problem List   Diagnosis Date Noted  . Smoker unmotivated to quit 08/15/2015  . Stress at home 08/15/2015  . MDD (major depressive disorder), recurrent episode, severe ([place]) 09/01/2014    Past Medical History:  Diagnosis Date  . Depression     Past Surgical History:  Procedure Laterality Date  . CHOLECYSTECTOMY    . MOUTH SURGERY      Social History  Substance Use Topics  . Smoking status: Current Every Day Smoker    Packs/day: 1.00  . Smokeless tobacco: Never Used  . Alcohol use 0.6 oz/week    1 Cans of beer per week    Family History  Problem Relation Age of Onset  . Congestive Heart Failure Mother   . Heart disease Mother   . Diabetes Father   . Stroke Father   . Scoliosis Daughter     Allergies  Allergen Reactions  . Codeine Itching    Medication list has been reviewed and updated.  Current Outpatient Prescriptions on File Prior to Visit  Medication Sig Dispense Refill  . ALPRAZolam (XANAX) 1 MG tablet Take 1 mg by mouth 3 (three) times daily as needed for anxiety.   5  . ibuprofen (ADVIL,MOTRIN) 600 MG tablet Take 1 tablet (600 mg total) by mouth every 8 (eight) hours as needed. 30 tablet 0  . nicotine (NICODERM CQ -  DOSED IN MG/24 HOURS) 14 mg/24hr patch Place 1 patch (14 mg total) onto the skin daily. 28 patch 0  . Efinaconazole (JUBLIA) 10 % SOLN Use as directed on the package.  Start in 2 weeks from today. (Patient not taking: Reported on 12/07/2016) 8 mL 12  . varenicline (CHANTIX CONTINUING MONTH PAK) 1 MG tablet Take 1 tablet (1 mg total) by mouth 2 (two) times daily. (Patient not taking: Reported on 12/07/2016) 60 tablet 1  . varenicline (CHANTIX STARTING MONTH PAK) 0.5 MG X 11 & 1 MG X 42 tablet Take 0.5 mg tablet once daily for 3 days, then increase to 0.5 mg tablet twice daily for 4 days, then increase to 1 mg tablet twice daily. (Patient not taking: Reported on 12/07/2016) 53 tablet 0   No current facility-administered medications on file prior to visit.     ROS ROS otherwise unremarkable unless listed above.  Physical Examination: BP 138/80 (BP Location: Right Arm, Patient Position: Sitting, Cuff Size: Large)   Pulse 76   Temp 97.7 F (36.5 C) (Oral)   Ht 5' 5.75" (1.67 m)   Wt 189 lb 9.6 oz (86 kg)   SpO2 98%   BMI 30.84 kg/m  Ideal Body Weight: Weight in (lb) to have BMI =  25: 153.4  Physical Exam  Constitutional: She is oriented to person, place, and time. She appears well-developed and well-nourished. No distress.  HENT:  Head: Normocephalic and atraumatic.  Right Ear: Tympanic membrane, external ear and ear canal normal.  Left Ear: Tympanic membrane, external ear and ear canal normal.  Nose: Mucosal edema and rhinorrhea present. Right sinus exhibits no maxillary sinus tenderness and no frontal sinus tenderness. Left sinus exhibits no maxillary sinus tenderness and no frontal sinus tenderness.  Mouth/Throat: No uvula swelling. No oropharyngeal exudate, posterior oropharyngeal edema or posterior oropharyngeal erythema.  Eyes: Conjunctivae and EOM are normal. Pupils are equal, round, and reactive to light.  Cardiovascular: Normal rate and regular rhythm.  Exam reveals no gallop, no  distant heart sounds and no friction rub.   No murmur heard. Pulmonary/Chest: Effort normal. No respiratory distress. She has no decreased breath sounds. She has no wheezes (faint wheezing). She has no rhonchi.  Lymphadenopathy:       Head (right side): No submandibular, no tonsillar, no preauricular and no posterior auricular adenopathy present.       Head (left side): No submandibular, no tonsillar, no preauricular and no posterior auricular adenopathy present.  Neurological: She is alert and oriented to person, place, and time.  Skin: She is not diaphoretic.  Psychiatric: She has a normal mood and affect. Her behavior is normal.     Assessment and Plan: Connie Shaw is a 61 y.o. female who is here today for cc of cough and body aches. --treating supportively. --will follow up with michael clark, pa-c in 5 days. --advised heavy hydration Respiratory infection - Plan: Guaifenesin (MUCINEX MAXIMUM STRENGTH) 1200 MG TB12, albuterol (PROVENTIL HFA;VENTOLIN HFA) 108 (90 Base) MCG/ACT inhaler, benzonatate (TESSALON) 100 MG capsule, predniSONE (DELTASONE) 20 MG tablet, promethazine-dextromethorphan (PROMETHAZINE-DM) 6.25-15 MG/5ML syrup  Bronchospasm - Plan: predniSONE (DELTASONE) 20 MG tablet  Cough - Plan: albuterol (PROVENTIL HFA;VENTOLIN HFA) 108 (90 Base) MCG/ACT inhaler, benzonatate (TESSALON) 100 MG capsule, predniSONE (DELTASONE) 20 MG tablet, promethazine-dextromethorphan (PROMETHAZINE-DM) 6.25-15 MG/5ML syrup  Ivar Drape, PA-C [facility] 3/8/20188:46 AM

## 2016-12-07 NOTE — Patient Instructions (Addendum)
Please make sure you are hydrating well with 64 oz or more.  The mucinex can only work with the hydration you do.   Please let us know if you are not improving.  Viral Respiratory Infection A viral respiratory infection is an illness that affects parts of the body used for breathing, like the lungs, nose, and throat. It is caused by a germ called a virus. Some examples of this kind of infection are:  A cold.  The flu (influenza).  A respiratory syncytial virus (RSV) infection. How do I know if I have this infection? Most of the time this infection causes:  A stuffy or runny nose.  Yellow or green fluid in the nose.  A cough.  Sneezing.  Tiredness (fatigue).  Achy muscles.  A sore throat.  Sweating or chills.  A fever.  A headache. How is this infection treated? If the flu is diagnosed early, it may be treated with an antiviral medicine. This medicine shortens the length of time a person has symptoms. Symptoms may be treated with over-the-counter and prescription medicines, such as:  Expectorants. These make it easier to cough up mucus.  Decongestant nasal sprays. Doctors do not prescribe antibiotic medicines for viral infections. They do not work with this kind of infection. How do I know if I should stay home? To keep others from getting sick, stay home if you have:  A fever.  A lasting cough.  A sore throat.  A runny nose.  Sneezing.  Muscles aches.  Headaches.  Tiredness.  Weakness.  Chills.  Sweating.  An upset stomach (nausea). Follow these instructions at home:  Rest as much as possible.  Take over-the-counter and prescription medicines only as told by your doctor.  Drink enough fluid to keep your pee (urine) clear or pale yellow.  Gargle with salt water. Do this 3-4 times per day or as needed. To make a salt-water mixture, dissolve -1 tsp of salt in 1 cup of warm water. Make sure the salt dissolves all the way.  Use nose drops made  from salt water. This helps with stuffiness (congestion). It also helps soften the skin around your nose.  Do not drink alcohol.  Do not use tobacco products, including cigarettes, chewing tobacco, and e-cigarettes. If you need help quitting, ask your doctor. Get help if:  Your symptoms last for 10 days or longer.  Your symptoms get worse over time.  You have a fever.  You have very bad pain in your face or forehead.  Parts of your jaw or neck become very swollen. Get help right away if:  You feel pain or pressure in your chest.  You have shortness of breath.  You faint or feel like you will faint.  You keep throwing up (vomiting).  You feel confused. This information is not intended to replace advice given to you by your health care provider. Make sure you discuss any questions you have with your health care provider. Document Released: 09/01/2008 Document Revised: 02/25/2016 Document Reviewed: 02/25/2015 Elsevier Interactive Patient Education  2017 [employer].    IF you received an x-ray today, you will receive an invoice from [facility]. Please contact [facility] at [phone] with questions or concerns regarding your invoice.   IF you received labwork today, you will receive an invoice from [facility]. Please contact [facility] at [phone] with questions or concerns regarding your invoice.   Our billing staff will not be able to assist you with questions regarding bills from these  companies.  You will be contacted with the lab results as soon as they are available. The fastest way to get your results is to activate your My Chart account. Instructions are located on the last page of this paperwork. If you have not heard from [place] regarding the results in 2 weeks, please contact this office.

## 2016-12-12 ENCOUNTER — Ambulatory Visit: Payer: 59 | Admitting: Physician Assistant

## 2016-12-13 ENCOUNTER — Ambulatory Visit (INDEPENDENT_AMBULATORY_CARE_PROVIDER_SITE_OTHER): Payer: 59

## 2016-12-13 ENCOUNTER — Encounter: Payer: Self-pay | Admitting: Physician Assistant

## 2016-12-13 ENCOUNTER — Ambulatory Visit (INDEPENDENT_AMBULATORY_CARE_PROVIDER_SITE_OTHER): Payer: 59 | Admitting: Physician Assistant

## 2016-12-13 VITALS — BP 136/70 | HR 82 | Temp 97.8°F | Resp 16 | Ht 65.75 in | Wt 191.0 lb

## 2016-12-13 DIAGNOSIS — R0781 Pleurodynia: Secondary | ICD-10-CM | POA: Diagnosis not present

## 2016-12-13 DIAGNOSIS — Z87891 Personal history of nicotine dependence: Secondary | ICD-10-CM | POA: Diagnosis not present

## 2016-12-13 DIAGNOSIS — R062 Wheezing: Secondary | ICD-10-CM

## 2016-12-13 DIAGNOSIS — M8589 Other specified disorders of bone density and structure, multiple sites: Secondary | ICD-10-CM | POA: Diagnosis not present

## 2016-12-13 MED ORDER — PREDNISONE 20 MG PO TABS: ORAL_TABLET | ORAL | 0 refills | Status: AC

## 2016-12-13 MED ORDER — IPRATROPIUM BROMIDE 0.02 % IN SOLN
0.5000 mg | Freq: Once | RESPIRATORY_TRACT | Status: AC
  Administered 2016-12-13: 0.5 mg via RESPIRATORY_TRACT

## 2016-12-13 MED ORDER — ALBUTEROL SULFATE (2.5 MG/3ML) 0.083% IN NEBU
2.5000 mg | INHALATION_SOLUTION | Freq: Once | RESPIRATORY_TRACT | Status: AC
  Administered 2016-12-13: 2.5 mg via RESPIRATORY_TRACT

## 2016-12-13 MED ORDER — DOXYCYCLINE HYCLATE 100 MG PO CAPS: 100.0000 mg | ORAL_CAPSULE | Freq: Two times a day (BID) | ORAL | 0 refills | Status: AC

## 2016-12-13 MED ORDER — CALCIUM CARBONATE-VITAMIN D 500-200 MG-UNIT PO TABS: 1.0000 | ORAL_TABLET | Freq: Two times a day (BID) | ORAL | 11 refills | Status: DC

## 2016-12-13 MED ORDER — CETIRIZINE-PSEUDOEPHEDRINE ER 5-120 MG PO TB12: 1.0000 | ORAL_TABLET | Freq: Two times a day (BID) | ORAL | 0 refills | Status: DC

## 2016-12-13 NOTE — Progress Notes (Signed)
12/16/2016 8:52 AM   DOB: May 30, 1956 / MRN: [id number]  SUBJECTIVE:  Connie Shaw is a 61 y.o. female presenting for cough and rib pain that starte 10 days ago. Says this started after falling over in a bush while trimming some hedges and she bumped into a tree upon getting up. Saw PA English recently and as prescribed PO steroids along with an URI regimen.  Felt instantly better after starting pred however but this only last for 24 hours.   Feels that she is getting worse.  She denies SOB, new DOE, leg swelling, dizziness, diaphoresis and radicular pain.    She had a recent bone density scan that showed osteopenia.  She is very concerned.    She wants to quit smoking but continues to "not feel ready."  She is allergic to codeine.   She  has a past medical history of Depression.    She  reports that she has been smoking.  She has been smoking about 1.00 pack per day. She has never used smokeless tobacco. She reports that she drinks about 0.6 oz of alcohol per week . She reports that she uses drugs, including Marijuana. She  has no sexual activity history on file. The patient  has a past surgical history that includes Mouth surgery and Cholecystectomy.  Her family history includes Congestive Heart Failure in her mother; Diabetes in her father; Heart disease in her mother; Scoliosis in her daughter; Stroke in her father.  Review of Systems  Constitutional: Negative for fever.  Respiratory: Positive for cough. Negative for hemoptysis, sputum production, shortness of breath and wheezing.   Cardiovascular: Positive for chest pain. Negative for leg swelling.    The problem list and medications were reviewed and updated by myself where necessary and exist elsewhere in the encounter.   OBJECTIVE:  BP 136/70   Pulse 82   Temp 97.8 F (36.6 C) (Oral)   Resp 16   Ht 5' 5.75" (1.67 m)   Wt 191 lb (86.6 kg)   SpO2 94%   PF 325 L/min   BMI 31.06 kg/m   Physical Exam  Constitutional:  She is oriented to person, place, and time. She appears well-developed and well-nourished.  Cardiovascular: Normal rate and regular rhythm.   Pulmonary/Chest: No respiratory distress. She has wheezes. She has no rales. She exhibits tenderness.  Musculoskeletal: Normal range of motion. She exhibits tenderness (right poteriorlateral ribs (lower)). She exhibits no edema.  Neurological: She is alert and oriented to person, place, and time.  Skin: Skin is warm and dry.    No results found for this or any previous visit (from the past 72 hour(s)).  No results found. Marland Kitchen i  Lab Results  Component Value Date   HGBA1C 5.0 08/13/2015      ASSESSMENT AND PLAN:  Connie Shaw was seen today for follow-up.  Diagnoses and all orders for this visit:  Rib pain on right side -     DG Chest 2 View; Future  History of smoking at least 1 pack per day for at least 30 years Comments: I have encourgaed her to quit again.  She has chantix in her cabinet at home and plans to start this.  Orders: -     predniSONE (DELTASONE) 20 MG tablet; Take 3 in the morning for 3 days, then 2 in the morning for 3 days, and then 1 in the morning for 3 days. -     doxycycline (VIBRAMYCIN) 100 MG capsule; Take 1 capsule (100  mg total) by mouth 2 (two) times daily.  Wheezing -     albuterol (PROVENTIL) (2.5 MG/3ML) 0.083% nebulizer solution 2.5 mg; Take 3 mLs (2.5 mg total) by nebulization once. -     ipratropium (ATROVENT) nebulizer solution 0.5 mg; Take 2.5 mLs (0.5 mg total) by nebulization once.  Osteopenia of multiple sites -     calcium-vitamin D (OSCAL 500/200 D-3) 500-200 MG-UNIT tablet; Take 1 tablet by mouth 2 (two) times daily.  Other orders -     cetirizine-pseudoephedrine (ZYRTEC-D) 5-120 MG tablet; Take 1 tablet by mouth 2 (two) times daily.    The patient is advised to call or return to clinic if she does not see an improvement in symptoms, or to seek the care of the closest emergency department if she  worsens with the above plan.   Philis Fendt, MHS, PA-C [facility] 12/16/2016 8:52 AM

## 2016-12-13 NOTE — Patient Instructions (Signed)
     IF you received an x-ray today, you will receive an invoice from [facility]. Please contact [facility] at [phone] with questions or concerns regarding your invoice.   IF you received labwork today, you will receive an invoice from [facility]. Please contact [facility] at [phone] with questions or concerns regarding your invoice.   Our billing staff will not be able to assist you with questions regarding bills from these companies.  You will be contacted with the lab results as soon as they are available. The fastest way to get your results is to activate your My Chart account. Instructions are located on the last page of this paperwork. If you have not heard from us regarding the results in 2 weeks, please contact this office.     

## 2016-12-21 ENCOUNTER — Telehealth: Payer: Self-pay | Admitting: Physician Assistant

## 2016-12-21 ENCOUNTER — Telehealth: Payer: Self-pay | Admitting: *Deleted

## 2016-12-21 MED ORDER — NICOTINE 10 MG IN INHA: 1.0000 | RESPIRATORY_TRACT | 6 refills | Status: DC | PRN

## 2016-12-21 NOTE — Telephone Encounter (Signed)
I don't really prescribe valium.  I will write for the nicotrol.  I am so aglad she has decided to stop smoking. Philis Fendt, MS, PA-C 2:03 PM, 12/21/2016

## 2016-12-21 NOTE — Telephone Encounter (Signed)
Advise patient that Carlis Abbott does not prescribe Valium but has called in Nicotral

## 2016-12-21 NOTE — Telephone Encounter (Signed)
Please call her

## 2016-12-21 NOTE — Telephone Encounter (Signed)
Pt is needing to get something else called in to help with smoking the patches are making her nauseated and she would like to try nicotrol? And valium   Best number [phone]

## 2016-12-21 NOTE — Telephone Encounter (Signed)
Seen 12/13/16 Please advise

## 2016-12-26 ENCOUNTER — Ambulatory Visit (INDEPENDENT_AMBULATORY_CARE_PROVIDER_SITE_OTHER): Payer: 59 | Admitting: Physician Assistant

## 2016-12-26 ENCOUNTER — Encounter: Payer: Self-pay | Admitting: Physician Assistant

## 2016-12-26 VITALS — BP 123/71 | HR 74 | Temp 98.8°F | Resp 16 | Ht 65.75 in | Wt 190.8 lb

## 2016-12-26 DIAGNOSIS — F17210 Nicotine dependence, cigarettes, uncomplicated: Secondary | ICD-10-CM

## 2016-12-26 DIAGNOSIS — IMO0001 Reserved for inherently not codable concepts without codable children: Secondary | ICD-10-CM

## 2016-12-26 DIAGNOSIS — Z789 Other specified health status: Secondary | ICD-10-CM | POA: Diagnosis not present

## 2016-12-26 MED ORDER — ALBUTEROL SULFATE (2.5 MG/3ML) 0.083% IN NEBU
2.5000 mg | INHALATION_SOLUTION | Freq: Once | RESPIRATORY_TRACT | Status: AC
  Administered 2016-12-26: 2.5 mg via RESPIRATORY_TRACT

## 2016-12-26 NOTE — Patient Instructions (Signed)
     IF you received an x-ray today, you will receive an invoice from [facility]. Please contact [facility] at [phone] with questions or concerns regarding your invoice.   IF you received labwork today, you will receive an invoice from [facility]. Please contact [facility] at [phone] with questions or concerns regarding your invoice.   Our billing staff will not be able to assist you with questions regarding bills from these companies.  You will be contacted with the lab results as soon as they are available. The fastest way to get your results is to activate your My Chart account. Instructions are located on the last page of this paperwork. If you have not heard from us regarding the results in 2 weeks, please contact this office.     

## 2016-12-26 NOTE — Progress Notes (Signed)
12/27/2016 9:22 AM   DOB: 09/14/1956 / MRN: [id number]  SUBJECTIVE:  Connie Shaw is a 61 y.o. female presenting for recheck of her lungs. Last I saw her she was having a COPD like flare and she was started on antibiotics and prednisone with complete resolution of her symptoms.  She comes in today for breathing studies.    She has been prescribed Nicotrol Inhaler and tells me that she has been successful in smoking only a few cigs daily.   She is allergic to codeine.   She  has a past medical history of Depression.    She  reports that she has been smoking.  She has been smoking about 1.00 pack per day. She has never used smokeless tobacco. She reports that she drinks about 0.6 oz of alcohol per week . She reports that she uses drugs, including Marijuana. She  has no sexual activity history on file. The patient  has a past surgical history that includes Mouth surgery and Cholecystectomy.  Her family history includes Congestive Heart Failure in her mother; Diabetes in her father; Heart disease in her mother; Scoliosis in her daughter; Stroke in her father.  Review of Systems  Constitutional: Negative for chills and fever.  Respiratory: Negative for cough, sputum production, shortness of breath and wheezing.   Cardiovascular: Negative for chest pain.  Skin: Negative for itching and rash.  Neurological: Negative for dizziness.    The problem list and medications were reviewed and updated by myself where necessary and exist elsewhere in the encounter.   OBJECTIVE:  BP 123/71 (BP Location: Right Arm, Patient Position: Sitting, Cuff Size: Small)   Pulse 74   Temp 98.8 F (37.1 C) (Oral)   Resp 16   Ht 5' 5.75" (1.67 m)   Wt 190 lb 12.8 oz (86.5 kg)   SpO2 97%   BMI 31.03 kg/m   BP Readings from Last 3 Encounters:  12/26/16 123/71  12/13/16 136/70  12/07/16 138/80     Physical Exam  Constitutional: She is oriented to person, place, and time.  Cardiovascular: Normal rate,  regular rhythm and normal heart sounds.   Pulmonary/Chest: Effort normal and breath sounds normal. She has no wheezes.  Abdominal: Soft. Bowel sounds are normal.  Musculoskeletal: Normal range of motion.  Neurological: She is alert and oriented to person, place, and time.       No results found for this or any previous visit (from the past 72 hour(s)).  No results found.  ASSESSMENT AND PLAN:  [name] was seen today for follow-up.  Diagnoses and all orders for this visit:  Smoking greater than 40 pack years -     CT CHEST LUNG CA SCREEN LOW DOSE W/O CM; Future  Baseline forced expiratory volume at end of one second (FEV1) 30% to 80% predicted: One her initial test she has a preserved FEV1/FVC which suggest a possible restrictive process. 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 Given her abnormal breathing studies I think it is still most likely she has some COPD given her history of heavy smoking.  She has an appointment with Dr. Melvyn Novas in about 1 month and I would love to have his opinion on a diagnosis.  She will keep that appointment and come back here about 1 week after her appointment with him so we can review the documentation together.  -     albuterol (PROVENTIL) (2.5 MG/3ML) 0.083% nebulizer solution 2.5 mg;  Take 3 mLs (2.5 mg total) by nebulization once. -     Cancel: Ambulatory referral to Pulmonology -     CT CHEST LUNG CA SCREEN LOW DOSE W/O CM; Future  Normal forced expiratory volume at end of one second to forced vital capacity ratio (FEV1/FVC): See problem two.     The patient is advised to call or return to clinic if she does not see an improvement in symptoms, or to seek the care of the closest emergency department if she worsens with the above plan.   Philis Fendt, MHS, PA-C [facility] 12/27/2016 9:22 AM

## 2016-12-27 ENCOUNTER — Institutional Professional Consult (permissible substitution): Payer: Self-pay | Admitting: Internal Medicine

## 2017-01-05 ENCOUNTER — Ambulatory Visit
Admission: RE | Admit: 2017-01-05 | Discharge: 2017-01-05 | Disposition: A | Discharge status: Home / Self Care | Payer: 59 | Source: Ambulatory Visit | Attending: Physician Assistant | Admitting: Physician Assistant

## 2017-01-05 DIAGNOSIS — F17210 Nicotine dependence, cigarettes, uncomplicated: Secondary | ICD-10-CM

## 2017-01-05 DIAGNOSIS — Z789 Other specified health status: Secondary | ICD-10-CM

## 2017-01-06 ENCOUNTER — Telehealth: Payer: Self-pay | Admitting: Family Medicine

## 2017-01-06 DIAGNOSIS — R911 Solitary pulmonary nodule: Secondary | ICD-10-CM | POA: Insufficient documentation

## 2017-01-06 DIAGNOSIS — R059 Cough, unspecified: Secondary | ICD-10-CM

## 2017-01-06 DIAGNOSIS — I7090 Unspecified atherosclerosis: Secondary | ICD-10-CM

## 2017-01-06 DIAGNOSIS — R05 Cough: Secondary | ICD-10-CM

## 2017-01-06 MED ORDER — ALBUTEROL SULFATE HFA 108 (90 BASE) MCG/ACT IN AERS: 2.0000 | INHALATION_SPRAY | RESPIRATORY_TRACT | 1 refills | Status: DC | PRN

## 2017-01-06 MED ORDER — ROSUVASTATIN CALCIUM 10 MG PO TABS: 5.0000 mg | ORAL_TABLET | Freq: Every day | ORAL | 3 refills | Status: DC

## 2017-01-06 NOTE — Telephone Encounter (Signed)
I spoke with Ms. Paternostro.  Advised that her results point towards COPD and this seems consistent with her symptoms and breathing studies.  I will send in an albuterol inhaler to the pharmacy for this.  She is continuing her use of nicotrol inhalers and trying to reduce her cig use to none.   Advised of the pulmonary nodule.  Will repeat a CT in 6 months.  Rads calling a likely benign findings. I would greatly appreciate Dr. Melvyn Novas to weigh in on the nodule as well.   Given atherosclerosis in her aorta will go ahead and start a low dose crestor. Fortunately she is not diabetic and has never had any blood pressure problems.  She will come back to see me a week after seeing Dr. Melvyn Novas and I will discuss the risk and benefits of daily ASA therapy.  Philis Fendt, MS, PA-C 3:47 PM, 01/06/2017

## 2017-01-06 NOTE — Telephone Encounter (Signed)
Pt is calling to get the results of her lung ct   Best number [phone]

## 2017-01-06 NOTE — Telephone Encounter (Signed)
See results and advise

## 2017-01-06 NOTE — Telephone Encounter (Signed)
Pt calling about CT she's is nervous and want Legrand Como to give her a call back

## 2017-01-08 ENCOUNTER — Other Ambulatory Visit: Payer: Self-pay | Admitting: Physician Assistant

## 2017-01-08 DIAGNOSIS — J988 Other specified respiratory disorders: Secondary | ICD-10-CM

## 2017-01-08 DIAGNOSIS — R059 Cough, unspecified: Secondary | ICD-10-CM

## 2017-01-08 DIAGNOSIS — R05 Cough: Secondary | ICD-10-CM

## 2017-01-13 ENCOUNTER — Encounter: Payer: Self-pay | Admitting: Internal Medicine

## 2017-01-13 ENCOUNTER — Ambulatory Visit (INDEPENDENT_AMBULATORY_CARE_PROVIDER_SITE_OTHER): Payer: 59 | Admitting: Internal Medicine

## 2017-01-13 VITALS — BP 126/84 | HR 82 | Ht 66.0 in | Wt 195.4 lb

## 2017-01-13 DIAGNOSIS — J449 Chronic obstructive pulmonary disease, unspecified: Secondary | ICD-10-CM

## 2017-01-13 DIAGNOSIS — R911 Solitary pulmonary nodule: Secondary | ICD-10-CM

## 2017-01-13 DIAGNOSIS — F17210 Nicotine dependence, cigarettes, uncomplicated: Secondary | ICD-10-CM | POA: Diagnosis not present

## 2017-01-13 NOTE — Patient Instructions (Signed)
You have only mild copd and unlikely to progress unless you continue to smoke   Pulmonary follow up is as needed

## 2017-01-13 NOTE — Progress Notes (Signed)
Subjective:     Patient ID: Connie Shaw, female   DOB: Feb 25, 1956    MRN: [id number]  HPI   37 yowf  Active smoker referred to pulmonary clinic 01/13/2017 by  Philis Fendt PA from [place] with [facility] pos several nodules and ? Of COPD  With GOLD II criteria on eval 01/13/2017     01/13/2017 [facility] office visit/ Connie Shaw   Chief Complaint  Patient presents with  . Pulmonary Consult    Referred by Philis Fendt, PA for eval of abnormal ct chest.   1st week in March 2018 fell and injured R Chest > CxR abn > CT with sev nodules and rec for 6 m per rad criteria  Doe = MMRC1 = can walk nl pace, flat grade, can't hurry or go uphills or steps s sob   Can't really tell proair helps  No obvious day to day or daytime variability or assoc excess/ purulent sputum or mucus plugs or hemoptysis or cp or chest tightness, subjective wheeze or overt sinus or hb symptoms. No unusual exp hx or h/o childhood pna/ asthma or knowledge of premature birth.  Sleeping ok without nocturnal  or early am exacerbation  of respiratory  c/o's or need for noct saba. Also denies any obvious fluctuation of symptoms with weather or environmental changes or other aggravating or alleviating factors except as outlined above   Current Medications, Allergies, Complete Past Medical History, Past Surgical History, Family History, and Social History were reviewed in [employer] record.  ROS  The following are not active complaints unless bolded sore throat, dysphagia, dental problems, itching, sneezing,  nasal congestion or excess/ purulent secretions, ear ache,   fever, chills, sweats, unintended wt loss, classically pleuritic or exertional cp,  orthopnea pnd or leg swelling, presyncope, palpitations, abdominal pain, anorexia, nausea, vomiting, diarrhea  or change in bowel or bladder habits, change in stools or urine, dysuria,hematuria,  rash, arthralgias, visual complaints, headache, numbness,  weakness or ataxia or problems with walking or coordination,  change in mood/affect or memory.          Review of Systems     Objective:   Physical Exam amb pleasant obese wf   Wt Readings from Last 3 Encounters:  01/13/17 195 lb 6.4 oz (88.6 kg)  12/26/16 190 lb 12.8 oz (86.5 kg)  12/13/16 191 lb (86.6 kg)    Vital signs reviewed - Note on arrival 02 sats  97% on RA      HEENT: nl dentition, turbinates bilaterally, and oropharynx. Nl external ear canals without cough reflex   NECK :  without JVD/Nodes/TM/ nl carotid upstrokes bilaterally   LUNGS: no acc muscle use,  Nl contour chest which is clear to A and P bilaterally without cough on insp or exp maneuvers   CV:  RRR  no s3 or murmur or increase in P2, and no edema   ABD:  soft and nontender with nl inspiratory excursion in the supine position. No bruits or organomegaly appreciated, bowel sounds nl  MS:  Nl gait/ ext warm without deformities, calf tenderness, cyanosis or clubbing No obvious joint restrictions   SKIN: warm and dry without lesions    NEURO:  alert, approp, nl sensorium with  no motor or cerebellar deficits apparent.     I personally reviewed images and agree with radiology impression as follows:  CTLCS Chest  01/05/17 No pneumothorax. No pleural effusion. Moderate centrilobular emphysema and mild diffuse bronchial wall thickening. There  is bullous emphysema in the posterior apical right lung. There are 2 scattered pulmonary nodules, largest a part solid pulmonary nodule in the posterior right upper lobe measuring 9.7 mm in volume derived mean diameter with a tiny 2 mm solid component (series 7/ image 74). No acute consolidative airspace disease or lung masses.        Assessment:

## 2017-01-14 NOTE — Assessment & Plan Note (Signed)
CT results reviewed with pt >>> Too small for PET or bx, not suspicious enough for excisional bx > really only option for now is follow the Fleischner society guidelines as rec by radiology > 6 m f/u approp, already arranged per PCP  Discussed in detail all the  indications, usual  risks and alternatives  relative to the benefits with patient who agrees to proceed with conservative f/u as outlined

## 2017-01-14 NOTE — Assessment & Plan Note (Signed)
Spirometry 01/13/2017  FEV1 1.84 (68%)  Ratio 64 p no rx prior    She perceives no improvement from saba likely because she still has an fev1 near 2 liters and is not aerobically active  As I explained to this patient in detail:  although there is mild copd present, it may not be clinically relevant:   it does not appear to be limiting activity tolerance any more than a set of worn tires limits someone from driving a car  around a parking lot.  A new set of Michelins might look good but would have no perceived impact on the performance of the car and would not be worth the cost.  That is to say:  I don't recommend aggressive pulmonary rx at this point unless limiting symptoms arise or acute exacerbations become as issue, neither of which is the case now.  I asked the patient to contact this office at any time in the future should either of these problems arise.    Pulmonary f/u can be prn   Total time devoted to counseling  > 50 % of initial 60 min office visit:  review case with pt/ discussion of options/alternatives/ personally creating written customized instructions  in presence of pt  then going over those specific  Instructions directly with the pt including how to use all of the meds but in particular covering each new medication in detail and the difference between the maintenance= "automatic" meds and the prns using an action plan format for the latter (If this problem/symptom => do that organization reading Left to right).  Please see AVS from this visit for a full list of these instructions which I personally wrote for this pt and  are unique to this visit.

## 2017-01-14 NOTE — Assessment & Plan Note (Signed)
>   3 min discussion I reviewed the Fletcher curve with the patient that basically indicates  if you quit smoking when your best day FEV1 is still well preserved (as is still relatively true here)  it is highly unlikely you will progress to severe disease and informed the patient there was  no medication on the market that has proven to alter the curve/ its downward trajectory  or the likelihood of progression of their disease(unlike other chronic medical conditions such as atheroclerosis where we do think we can change the natural hx with risk reducing meds)    Therefore stopping smoking and maintaining abstinence is the most important aspect of care, not choice of inhalers or for that matter, doctors.

## 2017-02-20 ENCOUNTER — Telehealth: Payer: Self-pay | Admitting: Physician Assistant

## 2017-02-20 NOTE — Telephone Encounter (Signed)
Any suggestions at this point?

## 2017-02-20 NOTE — Telephone Encounter (Signed)
PATIENT STATES Connie Shaw PRESCRIBED HER TO HAVE ROSUVASTATIN (CRESTOR) 10 MG IN APRIL. SHE HAS BEEN TAKING IT SINCE THEN, BUT SHE WANTS HIM TO KNOW THAT IT MAKES HER FEEL LIKE SHE "HAS THE FLU." SHE IS ACHY, CHILLS AND NO ENERGY. SHE TIRED TO GIVE IT SOME TIME TO WORK BUT IT DOES NOT AGREE WITH HER. BEST PHONE [phone] (CELL) PHARMACY CHOICE IS [facility] ON [address] IN [place]. [place]

## 2017-02-21 ENCOUNTER — Encounter: Payer: Self-pay | Admitting: Physician Assistant

## 2017-02-21 ENCOUNTER — Ambulatory Visit (INDEPENDENT_AMBULATORY_CARE_PROVIDER_SITE_OTHER): Payer: 59 | Admitting: Physician Assistant

## 2017-02-21 VITALS — BP 118/74 | HR 74 | Temp 98.2°F | Resp 18 | Ht 65.39 in | Wt 184.4 lb

## 2017-02-21 DIAGNOSIS — R5383 Other fatigue: Secondary | ICD-10-CM | POA: Diagnosis not present

## 2017-02-21 DIAGNOSIS — F341 Dysthymic disorder: Secondary | ICD-10-CM | POA: Diagnosis not present

## 2017-02-21 DIAGNOSIS — R944 Abnormal results of kidney function studies: Secondary | ICD-10-CM | POA: Diagnosis not present

## 2017-02-21 DIAGNOSIS — F17200 Nicotine dependence, unspecified, uncomplicated: Secondary | ICD-10-CM | POA: Diagnosis not present

## 2017-02-21 MED ORDER — NICOTINE 21 MG/24HR TD PT24: 21.0000 mg | MEDICATED_PATCH | Freq: Every day | TRANSDERMAL | 3 refills | Status: DC

## 2017-02-21 NOTE — Patient Instructions (Addendum)
  Stop the crestor for now.  I will contact you about your labs. We will need to repeat your CT scan sometime in September.   Come back in 4 months for a recheck.     IF you received an x-ray today, you will receive an invoice from [facility]. Please contact [facility] at [phone] with questions or concerns regarding your invoice.   IF you received labwork today, you will receive an invoice from [facility]. Please contact [facility] at [phone] with questions or concerns regarding your invoice.   Our billing staff will not be able to assist you with questions regarding bills from these companies.  You will be contacted with the lab results as soon as they are available. The fastest way to get your results is to activate your My Chart account. Instructions are located on the last page of this paperwork. If you have not heard from [place] regarding the results in 2 weeks, please contact this office.

## 2017-02-21 NOTE — Progress Notes (Signed)
02/24/2017 3:49 PM   DOB: November 05, 1955 / MRN: [id number]  SUBJECTIVE:  Connie Shaw is a 61 y.o. female presenting for check of fatigue.  Tells me this started after Crestor administration. She denies myalgia, cough, SOB, chest pain, HA.  Her daughter left the home three weeks ago after steeling her pt's grandmothers ring which was valued at 8,000 dollars. She also hates her job and tells me that she cries everyday on her way to work.   She is allergic to codeine.   She  has a past medical history of Depression.      She  has no sexual activity history on file. The patient  has a past surgical history that includes Mouth surgery and Cholecystectomy.  Her family history includes Congestive Heart Failure in her mother; Diabetes in her father; Heart disease in her mother; Scoliosis in her daughter; Stroke in her father.  ROS  The problem list and medications were reviewed and updated by myself where necessary and exist elsewhere in the encounter.   OBJECTIVE:  BP 118/74 (BP Location: Right Arm, Patient Position: Sitting, Cuff Size: Small)   Pulse 74   Temp 98.2 F (36.8 C) (Oral)   Resp 18   Ht 5' 5.39" (1.661 m)   Wt 184 lb 6.4 oz (83.6 kg)   SpO2 95%   BMI 30.32 kg/m   Physical Exam  Lab Results  Component Value Date   HGBA1C 5.0 02/21/2017   Results for orders placed or performed in visit on 02/21/17 (from the past 72 hour(s))  CMP14+EGFR     Status: Abnormal   Collection Time: 02/21/17  6:37 PM  Result Value Ref Range   Glucose 95 65 - 99 mg/dL   BUN 18 8 - 27 mg/dL   Creatinine, Ser 1.07 (H) 0.57 - 1.00 mg/dL   GFR calc non Af Amer 55 (L) >59 mL/min/1.73   GFR calc Af Amer 64 >59 mL/min/1.73   BUN/Creatinine Ratio 17 12 - 28   Sodium 143 134 - 144 mmol/L   Potassium 4.2 3.5 - 5.2 mmol/L   Chloride 104 96 - 106 mmol/L   CO2 25 18 - 29 mmol/L   Calcium 9.6 8.7 - 10.3 mg/dL   Total Protein 6.7 6.0 - 8.5 g/dL   Albumin 4.3 3.6 - 4.8 g/dL   Globulin, Total 2.4  1.5 - 4.5 g/dL   Albumin/Globulin Ratio 1.8 1.2 - 2.2   Bilirubin Total 0.2 0.0 - 1.2 mg/dL   Alkaline Phosphatase 61 39 - 117 IU/L   AST 15 0 - 40 IU/L   ALT 12 0 - 32 IU/L  CBC     Status: None   Collection Time: 02/21/17  6:37 PM  Result Value Ref Range   WBC 9.7 3.4 - 10.8 x10E3/uL   RBC 4.97 3.77 - 5.28 x10E6/uL   Hemoglobin 14.8 11.1 - 15.9 g/dL   Hematocrit 42.6 34.0 - 46.6 %   MCV 86 79 - 97 fL   MCH 29.8 26.6 - 33.0 pg   MCHC 34.7 31.5 - 35.7 g/dL   RDW 13.9 12.3 - 15.4 %   Platelets 310 150 - 379 x10E3/uL  TSH     Status: None   Collection Time: 02/21/17  6:37 PM  Result Value Ref Range   TSH 0.959 0.450 - 4.500 uIU/mL  CK     Status: None   Collection Time: 02/21/17  6:37 PM  Result Value Ref Range   Total CK 141 24 -  173 U/L  Hemoglobin A1c     Status: None   Collection Time: 02/21/17  6:37 PM  Result Value Ref Range   Hgb A1c MFr Bld 5.0 4.8 - 5.6 %    Comment:          Pre-diabetes: 5.7 - 6.4          Diabetes: >6.4          Glycemic control for adults with diabetes: <7.0    Est. average glucose Bld gHb Est-mCnc 97 mg/dL    No results found.  ASSESSMENT AND PLAN:  [name] was seen today for follow-up.  Diagnoses and all orders for this visit:  Smoker: Will continue to try and find a way for her to quit.       -     nicotine (NICODERM CQ) 21 mg/24hr patch; Place 1 patch (21 mg total) onto the skin daily.  Dysthymia: She would like a new counselor.  Sees Dr. Toy Care for medications.  -     Ambulatory referral to Psychology  Fatigue, unspecified type:  Labs normal. Pt feeling better after crestor cessation.  Stopping that and starting with a low dose of atorvastatin given coronary athleroscrosis found on CT scan.  -     CMP14+EGFR -     CBC -     TSH -     CK -     Hemoglobin A1c -     atorvastatin (LIPITOR) 10 MG tablet; Take 0.5-1 tablets (5-10 mg total) by mouth daily. Take 5 mg for the first week, then move up to the full tab.    The patient  is advised to call or return to clinic if she does not see an improvement in symptoms, or to seek the care of the closest emergency department if she worsens with the above plan.   Philis Fendt, MHS, PA-C [facility] 02/24/2017 3:49 PM

## 2017-02-21 NOTE — Telephone Encounter (Signed)
I spoke with her one the phone.  Hopefully she will come in today. Philis Fendt, MS, PA-C 10:56 AM, 02/21/2017

## 2017-02-22 LAB — CMP14+EGFR
A/G RATIO: 1.8 (ref 1.2–2.2)
ALBUMIN: 4.3 g/dL (ref 3.6–4.8)
ALT: 12 IU/L (ref 0–32)
AST: 15 IU/L (ref 0–40)
Alkaline Phosphatase: 61 IU/L (ref 39–117)
BILIRUBIN TOTAL: 0.2 mg/dL (ref 0.0–1.2)
BUN / CREAT RATIO: 17 (ref 12–28)
BUN: 18 mg/dL (ref 8–27)
CHLORIDE: 104 mmol/L (ref 96–106)
CO2: 25 mmol/L (ref 18–29)
Calcium: 9.6 mg/dL (ref 8.7–10.3)
Creatinine, Ser: 1.07 mg/dL — ABNORMAL HIGH (ref 0.57–1.00)
GFR calc non Af Amer: 55 mL/min/{1.73_m2} — ABNORMAL LOW (ref >59)
GFR, EST AFRICAN AMERICAN: 64 mL/min/{1.73_m2} (ref >59)
GLOBULIN, TOTAL: 2.4 g/dL (ref 1.5–4.5)
Glucose: 95 mg/dL (ref 65–99)
POTASSIUM: 4.2 mmol/L (ref 3.5–5.2)
SODIUM: 143 mmol/L (ref 134–144)
TOTAL PROTEIN: 6.7 g/dL (ref 6.0–8.5)

## 2017-02-22 LAB — HEMOGLOBIN A1C
Est. average glucose Bld gHb Est-mCnc: 97 mg/dL
Hgb A1c MFr Bld: 5 % (ref 4.8–5.6)

## 2017-02-22 LAB — CBC
HEMATOCRIT: 42.6 % (ref 34.0–46.6)
Hemoglobin: 14.8 g/dL (ref 11.1–15.9)
MCH: 29.8 pg (ref 26.6–33.0)
MCHC: 34.7 g/dL (ref 31.5–35.7)
MCV: 86 fL (ref 79–97)
Platelets: 310 10*3/uL (ref 150–379)
RBC: 4.97 x10E6/uL (ref 3.77–5.28)
RDW: 13.9 % (ref 12.3–15.4)
WBC: 9.7 10*3/uL (ref 3.4–10.8)

## 2017-02-22 LAB — TSH: TSH: 0.959 u[IU]/mL (ref 0.450–4.500)

## 2017-02-22 LAB — CK: CK TOTAL: 141 U/L (ref 24–173)

## 2017-02-24 DIAGNOSIS — R944 Abnormal results of kidney function studies: Secondary | ICD-10-CM | POA: Insufficient documentation

## 2017-02-24 DIAGNOSIS — N183 Chronic kidney disease, stage 3 unspecified: Secondary | ICD-10-CM | POA: Insufficient documentation

## 2017-02-24 MED ORDER — ATORVASTATIN CALCIUM 10 MG PO TABS: 5.0000 mg | ORAL_TABLET | Freq: Every day | ORAL | 3 refills | Status: DC

## 2017-05-04 ENCOUNTER — Other Ambulatory Visit: Payer: Self-pay | Admitting: Physician Assistant

## 2017-05-04 DIAGNOSIS — R05 Cough: Secondary | ICD-10-CM

## 2017-05-04 DIAGNOSIS — R059 Cough, unspecified: Secondary | ICD-10-CM

## 2017-05-04 DIAGNOSIS — J988 Other specified respiratory disorders: Secondary | ICD-10-CM

## 2017-05-22 ENCOUNTER — Telehealth: Payer: Self-pay

## 2017-05-22 ENCOUNTER — Other Ambulatory Visit: Payer: Self-pay | Admitting: Physician Assistant

## 2017-05-22 ENCOUNTER — Ambulatory Visit: Payer: 59 | Admitting: Physician Assistant

## 2017-05-22 DIAGNOSIS — R059 Cough, unspecified: Secondary | ICD-10-CM

## 2017-05-22 DIAGNOSIS — J988 Other specified respiratory disorders: Secondary | ICD-10-CM

## 2017-05-22 DIAGNOSIS — R05 Cough: Secondary | ICD-10-CM

## 2017-05-22 MED ORDER — PRAVASTATIN SODIUM 20 MG PO TABS: 20.0000 mg | ORAL_TABLET | Freq: Every day | ORAL | 3 refills | Status: DC

## 2017-05-22 MED ORDER — CALCIUM CARBONATE-VITAMIN D3 600-400 MG-UNIT PO TABS: ORAL_TABLET | ORAL | 11 refills | Status: DC

## 2017-05-22 MED ORDER — PROMETHAZINE-DM 6.25-15 MG/5ML PO SYRP: ORAL_SOLUTION | ORAL | 1 refills | Status: DC

## 2017-05-22 NOTE — Progress Notes (Signed)
Meds refilled per phone call. Patient with historical difficulty with taking statins due to flu like symptoms.  She continues to smoke. Will mover her to low dose pravastatin and hopefully she will tolerate. Philis Fendt, MS, PA-C 1:20 PM, 05/22/2017

## 2017-05-22 NOTE — Telephone Encounter (Signed)
Pt calling to get refill on promethazine cough syrup and calcium supplement. Pt would also like to change her statin from lipitor to pravastatin. Pt stated that she did not have the money for her appointment today and will reschedule. Please call patient if your unable to refill these medications.

## 2017-05-26 ENCOUNTER — Ambulatory Visit (INDEPENDENT_AMBULATORY_CARE_PROVIDER_SITE_OTHER): Payer: 59

## 2017-05-26 ENCOUNTER — Ambulatory Visit (INDEPENDENT_AMBULATORY_CARE_PROVIDER_SITE_OTHER): Payer: 59 | Admitting: Podiatry

## 2017-05-26 ENCOUNTER — Other Ambulatory Visit: Payer: Self-pay | Admitting: Podiatry

## 2017-05-26 DIAGNOSIS — B079 Viral wart, unspecified: Secondary | ICD-10-CM | POA: Diagnosis not present

## 2017-05-26 DIAGNOSIS — M7662 Achilles tendinitis, left leg: Secondary | ICD-10-CM

## 2017-05-26 DIAGNOSIS — M79672 Pain in left foot: Secondary | ICD-10-CM | POA: Diagnosis not present

## 2017-05-26 DIAGNOSIS — M722 Plantar fascial fibromatosis: Secondary | ICD-10-CM

## 2017-05-26 DIAGNOSIS — L989 Disorder of the skin and subcutaneous tissue, unspecified: Secondary | ICD-10-CM

## 2017-05-26 NOTE — Patient Instructions (Signed)

## 2017-05-29 NOTE — Progress Notes (Signed)
Subjective:    Patient ID: Connie Shaw, female   DOB: 61 y.o.   MRN: [id number]   HPI patient presents with very painful lesion plantar aspect left heel and states that she's had it looked at a number of times without successful resolution    Review of Systems  All other systems reviewed and are negative.       Objective:  Physical Exam  Constitutional: She appears well-developed and well-nourished.  Cardiovascular: Intact distal pulses.   Musculoskeletal: Normal range of motion.  Neurological: She is alert.  Skin: Skin is warm.  Nursing note and vitals reviewed.  neurovascular status found to be intact muscle strength adequate range of motion within normal limits with patient noted to have painful lesion plantar aspect left heel measuring approximately 1.2 cm x 1.1 cm. 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 I do recommend removal of the mass and I did explain the procedure and risk and she wants to have this removed and today I went ahead and I infiltrated 60 mg Xylocaine with epinephrine and under sterile conditions excised the mass entirely sent for pathological back evaluation and placed phenol on the base was sterile dressing. Gave instructions on reduced activity

## 2017-06-02 ENCOUNTER — Telehealth: Payer: Self-pay | Admitting: Podiatry

## 2017-06-02 NOTE — Telephone Encounter (Signed)
Dr. Paulla Dolly removed something from my left foot on 24 August and sent it off for pathology. I was calling to see if I can get the results please.

## 2017-06-02 NOTE — Telephone Encounter (Addendum)
Left message informing pt the results had not returned, we would call with instruction once results were received.06/06/2017-Pt called for biopsy results. 06/07/2017-[facility] faxed results. Dr. Paulla Dolly reviewed the biopsy of 05/26/2017 as verruca over weight bearing area. I informed pt and she states the area is already better, she is walking around without pain. I informed Dr. Paulla Dolly.

## 2017-06-07 ENCOUNTER — Telehealth: Payer: Self-pay | Admitting: Podiatry

## 2017-06-07 NOTE — Telephone Encounter (Signed)
I was calling to see if my pathology results have come in yet. I was told by Dr. Paulla Dolly that they should be in by Friday 31 August. I'm just a little concerned.

## 2017-06-24 NOTE — Telephone Encounter (Signed)
error 

## 2017-11-23 ENCOUNTER — Ambulatory Visit (INDEPENDENT_AMBULATORY_CARE_PROVIDER_SITE_OTHER): Payer: 59

## 2017-11-23 ENCOUNTER — Encounter: Payer: Self-pay | Admitting: Podiatry

## 2017-11-23 ENCOUNTER — Other Ambulatory Visit: Payer: Self-pay | Admitting: Podiatry

## 2017-11-23 ENCOUNTER — Ambulatory Visit: Payer: 59 | Admitting: Podiatry

## 2017-11-23 DIAGNOSIS — M7662 Achilles tendinitis, left leg: Secondary | ICD-10-CM | POA: Diagnosis not present

## 2017-11-23 DIAGNOSIS — M79672 Pain in left foot: Secondary | ICD-10-CM

## 2017-11-23 DIAGNOSIS — M779 Enthesopathy, unspecified: Secondary | ICD-10-CM

## 2017-11-23 MED ORDER — DICLOFENAC SODIUM 75 MG PO TBEC: 75.0000 mg | DELAYED_RELEASE_TABLET | Freq: Two times a day (BID) | ORAL | 2 refills | Status: DC

## 2017-11-23 MED ORDER — TRIAMCINOLONE ACETONIDE 10 MG/ML IJ SUSP
10.0000 mg | Freq: Once | INTRAMUSCULAR | Status: AC
  Administered 2017-11-23: 10 mg

## 2017-11-23 NOTE — Patient Instructions (Signed)
Achilles Tendinitis  with Rehab Achilles tendinitis is a disorder of the Achilles tendon. The Achilles tendon connects the large calf muscles (Gastrocnemius and Soleus) to the heel bone (calcaneus). This tendon is sometimes called the heel cord. It is important for pushing-off and standing on your toes and is important for walking, running, or jumping. Tendinitis is often caused by overuse and repetitive microtrauma. SYMPTOMS  Pain, tenderness, swelling, warmth, and redness may occur over the Achilles tendon even at rest.  Pain with pushing off, or flexing or extending the ankle.  Pain that is worsened after or during activity. CAUSES   Overuse sometimes seen with rapid increase in exercise programs or in sports requiring running and jumping.  Poor physical conditioning (strength and flexibility or endurance).  Running sports, especially training running down hills.  Inadequate warm-up before practice or play or failure to stretch before participation.  Injury to the tendon. PREVENTION   Warm up and stretch before practice or competition.  Allow time for adequate rest and recovery between practices and competition.  Keep up conditioning.  Keep up ankle and leg flexibility.  Improve or keep muscle strength and endurance.  Improve cardiovascular fitness.  Use proper technique.  Use proper equipment (shoes, skates).  To help prevent recurrence, taping, protective strapping, or an adhesive bandage may be recommended for several weeks after healing is complete. PROGNOSIS   Recovery may take weeks to several months to heal.  Longer recovery is expected if symptoms have been prolonged.  Recovery is usually quicker if the inflammation is due to a direct blow as compared with overuse or sudden strain. RELATED COMPLICATIONS   Healing time will be prolonged if the condition is not correctly treated. The injury must be given plenty of time to heal.  Symptoms can reoccur if  activity is resumed too soon.  Untreated, tendinitis may increase the risk of tendon rupture requiring additional time for recovery and possibly surgery. TREATMENT   The first treatment consists of rest anti-inflammatory medication, and ice to relieve the pain.  Stretching and strengthening exercises after resolution of pain will likely help reduce the risk of recurrence. Referral to a physical therapist or athletic trainer for further evaluation and treatment may be helpful.  A walking boot or cast may be recommended to rest the Achilles tendon. This can help break the cycle of inflammation and microtrauma.  Arch supports (orthotics) may be prescribed or recommended by your caregiver as an adjunct to therapy and rest.  Surgery to remove the inflamed tendon lining or degenerated tendon tissue is rarely necessary and has shown less than predictable results. MEDICATION   Nonsteroidal anti-inflammatory medications, such as aspirin and ibuprofen, may be used for pain and inflammation relief. Do not take within 7 days before surgery. Take these as directed by your caregiver. Contact your caregiver immediately if any bleeding, stomach upset, or signs of allergic reaction occur. Other minor pain relievers, such as acetaminophen, may also be used.  Pain relievers may be prescribed as necessary by your caregiver. Do not take prescription pain medication for longer than 4 to 7 days. Use only as directed and only as much as you need.  Cortisone injections are rarely indicated. Cortisone injections may weaken tendons and predispose to rupture. It is better to give the condition more time to heal than to use them. HEAT AND COLD  Cold is used to relieve pain and reduce inflammation for acute and chronic Achilles tendinitis. Cold should be applied for 10 to 15 minutes   every 2 to 3 hours for inflammation and pain and immediately after any activity that aggravates your symptoms. Use ice packs or an ice  massage.  Heat may be used before performing stretching and strengthening activities prescribed by your caregiver. Use a heat pack or a warm soak. SEEK MEDICAL CARE IF:  Symptoms get worse or do not improve in 2 weeks despite treatment.  New, unexplained symptoms develop. Drugs used in treatment may produce side effects.  EXERCISES:  RANGE OF MOTION (ROM) AND STRETCHING EXERCISES - Achilles Tendinitis  These exercises may help you when beginning to rehabilitate your injury. Your symptoms may resolve with or without further involvement from your physician, physical therapist or athletic trainer. While completing these exercises, remember:   Restoring tissue flexibility helps normal motion to return to the joints. This allows healthier, less painful movement and activity.  An effective stretch should be held for at least 30 seconds.  A stretch should never be painful. You should only feel a gentle lengthening or release in the stretched tissue.  STRETCH  Gastroc, Standing   Place hands on wall.  Extend right / left leg, keeping the front knee somewhat bent.  Slightly point your toes inward on your back foot.  Keeping your right / left heel on the floor and your knee straight, shift your weight toward the wall, not allowing your back to arch.  You should feel a gentle stretch in the right / left calf. Hold this position for 10 seconds. Repeat 3 times. Complete this stretch 2 times per day.  STRETCH  Soleus, Standing   Place hands on wall.  Extend right / left leg, keeping the other knee somewhat bent.  Slightly point your toes inward on your back foot.  Keep your right / left heel on the floor, bend your back knee, and slightly shift your weight over the back leg so that you feel a gentle stretch deep in your back calf.  Hold this position for 10 seconds. Repeat 3 times. Complete this stretch 2 times per day.  STRETCH  Gastrocsoleus, Standing  Note: This exercise can place  a lot of stress on your foot and ankle. Please complete this exercise only if specifically instructed by your caregiver.   Place the ball of your right / left foot on a step, keeping your other foot firmly on the same step.  Hold on to the wall or a rail for balance.  Slowly lift your other foot, allowing your body weight to press your heel down over the edge of the step.  You should feel a stretch in your right / left calf.  Hold this position for 10 seconds.  Repeat this exercise with a slight bend in your knee. Repeat 3 times. Complete this stretch 2 times per day.   STRENGTHENING EXERCISES - Achilles Tendinitis These exercises may help you when beginning to rehabilitate your injury. They may resolve your symptoms with or without further involvement from your physician, physical therapist or athletic trainer. While completing these exercises, remember:   Muscles can gain both the endurance and the strength needed for everyday activities through controlled exercises.  Complete these exercises as instructed by your physician, physical therapist or athletic trainer. Progress the resistance and repetitions only as guided.  You may experience muscle soreness or fatigue, but the pain or discomfort you are trying to eliminate should never worsen during these exercises. If this pain does worsen, stop and make certain you are following the directions exactly. If   the pain is still present after adjustments, discontinue the exercise until you can discuss the trouble with your clinician.  STRENGTH - Plantar-flexors   Sit with your right / left leg extended. Holding onto both ends of a rubber exercise band/tubing, loop it around the ball of your foot. Keep a slight tension in the band.  Slowly push your toes away from you, pointing them downward.  Hold this position for 10 seconds. Return slowly, controlling the tension in the band/tubing. Repeat 3 times. Complete this exercise 2 times per day.     STRENGTH - Plantar-flexors   Stand with your feet shoulder width apart. Steady yourself with a wall or table using as little support as needed.  Keeping your weight evenly spread over the width of your feet, rise up on your toes.*  Hold this position for 10 seconds. Repeat 3 times. Complete this exercise 2 times per day.  *If this is too easy, shift your weight toward your right / left leg until you feel challenged. Ultimately, you may be asked to do this exercise with your right / left foot only.  STRENGTH  Plantar-flexors, Eccentric  Note: This exercise can place a lot of stress on your foot and ankle. Please complete this exercise only if specifically instructed by your caregiver.   Place the balls of your feet on a step. With your hands, use only enough support from a wall or rail to keep your balance.  Keep your knees straight and rise up on your toes.  Slowly shift your weight entirely to your right / left toes and pick up your opposite foot. Gently and with controlled movement, lower your weight through your right / left foot so that your heel drops below the level of the step. You will feel a slight stretch in the back of your calf at the end position.  Use the healthy leg to help rise up onto the balls of both feet, then lower weight only on the right / left leg again. Build up to 15 repetitions. Then progress to 3 consecutive sets of 15 repetitions.*  After completing the above exercise, complete the same exercise with a slight knee bend (about 30 degrees). Again, build up to 15 repetitions. Then progress to 3 consecutive sets of 15 repetitions.* Perform this exercise 2 times per day.  *When you easily complete 3 sets of 15, your physician, physical therapist or athletic trainer may advise you to add resistance by wearing a backpack filled with additional weight.  STRENGTH - Plantar Flexors, Seated   Sit on a chair that allows your feet to rest flat on the ground. If  necessary, sit at the edge of the chair.  Keeping your toes firmly on the ground, lift your right / left heel as far as you can without increasing any discomfort in your ankle. Repeat 3 times. Complete this exercise 2 times a day.    Plantar Fasciitis (Heel Spur Syndrome) with Rehab The plantar fascia is a fibrous, ligament-like, soft-tissue structure that spans the bottom of the foot. Plantar fasciitis is a condition that causes pain in the foot due to inflammation of the tissue. SYMPTOMS   Pain and tenderness on the underneath side of the foot.  Pain that worsens with standing or walking. CAUSES  Plantar fasciitis is caused by irritation and injury to the plantar fascia on the underneath side of the foot. Common mechanisms of injury include:  Direct trauma to bottom of the foot.  Damage to a small   nerve that runs under the foot where the main fascia attaches to the heel bone.  Stress placed on the plantar fascia due to bone spurs. RISK INCREASES WITH:   Activities that place stress on the plantar fascia (running, jumping, pivoting, or cutting).  Poor strength and flexibility.  Improperly fitted shoes.  Tight calf muscles.  Flat feet.  Failure to warm-up properly before activity.  Obesity. PREVENTION  Warm up and stretch properly before activity.  Allow for adequate recovery between workouts.  Maintain physical fitness:  Strength, flexibility, and endurance.  Cardiovascular fitness.  Maintain a health body weight.  Avoid stress on the plantar fascia.  Wear properly fitted shoes, including arch supports for individuals who have flat feet.  PROGNOSIS  If treated properly, then the symptoms of plantar fasciitis usually resolve without surgery. However, occasionally surgery is necessary.  RELATED COMPLICATIONS   Recurrent symptoms that may result in a chronic condition.  Problems of the lower back that are caused by compensating for the injury, such as  limping.  Pain or weakness of the foot during push-off following surgery.  Chronic inflammation, scarring, and partial or complete fascia tear, occurring more often from repeated injections.  TREATMENT  Treatment initially involves the use of ice and medication to help reduce pain and inflammation. The use of strengthening and stretching exercises may help reduce pain with activity, especially stretches of the Achilles tendon. These exercises may be performed at home or with a therapist. Your caregiver may recommend that you use heel cups of arch supports to help reduce stress on the plantar fascia. Occasionally, corticosteroid injections are given to reduce inflammation. If symptoms persist for greater than 6 months despite non-surgical (conservative), then surgery may be recommended.   MEDICATION   If pain medication is necessary, then nonsteroidal anti-inflammatory medications, such as aspirin and ibuprofen, or other minor pain relievers, such as acetaminophen, are often recommended.  Do not take pain medication within 7 days before surgery.  Prescription pain relievers may be given if deemed necessary by your caregiver. Use only as directed and only as much as you need.  Corticosteroid injections may be given by your caregiver. These injections should be reserved for the most serious cases, because they may only be given a certain number of times.  HEAT AND COLD  Cold treatment (icing) relieves pain and reduces inflammation. Cold treatment should be applied for 10 to 15 minutes every 2 to 3 hours for inflammation and pain and immediately after any activity that aggravates your symptoms. Use ice packs or massage the area with a piece of ice (ice massage).  Heat treatment may be used prior to performing the stretching and strengthening activities prescribed by your caregiver, physical therapist, or athletic trainer. Use a heat pack or soak the injury in warm water.  SEEK IMMEDIATE MEDICAL  CARE IF:  Treatment seems to offer no benefit, or the condition worsens.  Any medications produce adverse side effects.  EXERCISES- RANGE OF MOTION (ROM) AND STRETCHING EXERCISES - Plantar Fasciitis (Heel Spur Syndrome) These exercises may help you when beginning to rehabilitate your injury. Your symptoms may resolve with or without further involvement from your physician, physical therapist or athletic trainer. While completing these exercises, remember:   Restoring tissue flexibility helps normal motion to return to the joints. This allows healthier, less painful movement and activity.  An effective stretch should be held for at least 30 seconds.  A stretch should never be painful. You should only feel a gentle lengthening   or release in the stretched tissue.  RANGE OF MOTION - Toe Extension, Flexion  Sit with your right / left leg crossed over your opposite knee.  Grasp your toes and gently pull them back toward the top of your foot. You should feel a stretch on the bottom of your toes and/or foot.  Hold this stretch for 10 seconds.  Now, gently pull your toes toward the bottom of your foot. You should feel a stretch on the top of your toes and or foot.  Hold this stretch for 10 seconds. Repeat  times. Complete this stretch 3 times per day.   RANGE OF MOTION - Ankle Dorsiflexion, Active Assisted  Remove shoes and sit on a chair that is preferably not on a carpeted surface.  Place right / left foot under knee. Extend your opposite leg for support.  Keeping your heel down, slide your right / left foot back toward the chair until you feel a stretch at your ankle or calf. If you do not feel a stretch, slide your bottom forward to the edge of the chair, while still keeping your heel down.  Hold this stretch for 10 seconds. Repeat 3 times. Complete this stretch 2 times per day.   STRETCH  Gastroc, Standing  Place hands on wall.  Extend right / left leg, keeping the front knee  somewhat bent.  Slightly point your toes inward on your back foot.  Keeping your right / left heel on the floor and your knee straight, shift your weight toward the wall, not allowing your back to arch.  You should feel a gentle stretch in the right / left calf. Hold this position for 10 seconds. Repeat 3 times. Complete this stretch 2 times per day.  STRETCH  Soleus, Standing  Place hands on wall.  Extend right / left leg, keeping the other knee somewhat bent.  Slightly point your toes inward on your back foot.  Keep your right / left heel on the floor, bend your back knee, and slightly shift your weight over the back leg so that you feel a gentle stretch deep in your back calf.  Hold this position for 10 seconds. Repeat 3 times. Complete this stretch 2 times per day.  STRETCH  Gastrocsoleus, Standing  Note: This exercise can place a lot of stress on your foot and ankle. Please complete this exercise only if specifically instructed by your caregiver.   Place the ball of your right / left foot on a step, keeping your other foot firmly on the same step.  Hold on to the wall or a rail for balance.  Slowly lift your other foot, allowing your body weight to press your heel down over the edge of the step.  You should feel a stretch in your right / left calf.  Hold this position for 10 seconds.  Repeat this exercise with a slight bend in your right / left knee. Repeat 3 times. Complete this stretch 2 times per day.   STRENGTHENING EXERCISES - Plantar Fasciitis (Heel Spur Syndrome)  These exercises may help you when beginning to rehabilitate your injury. They may resolve your symptoms with or without further involvement from your physician, physical therapist or athletic trainer. While completing these exercises, remember:   Muscles can gain both the endurance and the strength needed for everyday activities through controlled exercises.  Complete these exercises as instructed by  your physician, physical therapist or athletic trainer. Progress the resistance and repetitions only as guided.  STRENGTH -   Towel Curls  Sit in a chair positioned on a non-carpeted surface.  Place your foot on a towel, keeping your heel on the floor.  Pull the towel toward your heel by only curling your toes. Keep your heel on the floor. Repeat 3 times. Complete this exercise 2 times per day.  STRENGTH - Ankle Inversion  Secure one end of a rubber exercise band/tubing to a fixed object (table, pole). Loop the other end around your foot just before your toes.  Place your fists between your knees. This will focus your strengthening at your ankle.  Slowly, pull your big toe up and in, making sure the band/tubing is positioned to resist the entire motion.  Hold this position for 10 seconds.  Have your muscles resist the band/tubing as it slowly pulls your foot back to the starting position. Repeat 3 times. Complete this exercises 2 times per day.  Document Released: 09/19/2005 Document Revised: 12/12/2011 Document Reviewed: 01/01/2009 [employer] Patient Information 2014 [employer]  

## 2017-11-26 NOTE — Progress Notes (Signed)
Subjective:   Patient ID: Connie Shaw, female   DOB: 62 y.o.   MRN: [id number]   HPI Patient presents with exquisite discomfort in the posterior aspect of the left heel with pain when palpated and states it has been hurting her for several months and making it hard to wear shoe gear comfortably to walk comfortably   ROS      Objective:  Physical Exam  Neurovascular status intact with patient noted to have mild equinus but no indication of structural changes of the posterior heel.  It is very painful when pressed at the insertional point of the calcaneus with the Achilles tendon left lateral side     Assessment:  Inflammatory Achilles tendinitis left with acute inflammation     Plan:  H&P condition reviewed and discussed.  I recommended careful injection explaining risk of injection associated with rupture and she is willing to accept risk.  I prepped the lateral side of the posterior heel and injected the lateral portion being careful to keep it away from the central medial portion of the insertion of the Achilles into the calcaneus.  I injected 3 mg dexamethasone Kenalog 5 mg Xylocaine and then applied air fracture walker with instructions for complete immobilization along with aggressive ice therapy.  Reappoint 3 weeks or earlier if needed  X-ray indicates there is spur formation but no indications of stress fracture or detachment

## 2017-12-14 ENCOUNTER — Encounter: Payer: Self-pay | Admitting: Podiatry

## 2017-12-14 ENCOUNTER — Ambulatory Visit: Payer: 59 | Admitting: Podiatry

## 2017-12-14 DIAGNOSIS — M7662 Achilles tendinitis, left leg: Secondary | ICD-10-CM | POA: Diagnosis not present

## 2017-12-18 ENCOUNTER — Other Ambulatory Visit: Payer: 59

## 2017-12-18 NOTE — Progress Notes (Signed)
Subjective:   Patient ID: Connie Shaw, female   DOB: 62 y.o.   MRN: [id number]   HPI Patient presents with chronic Achilles tendinitis left is painful when pressed the patient's tried wider shoes other modalities and it is not getting better and she wants to know what can be done more for the long-term   ROS      Objective:  Physical Exam  Neurovascular status intact with posterior discomfort heel left over right with inflammation noted around the posterior insertion of the Achilles tendon     Assessment:  Achilles tendinitis left heel very tender when palpated lateral side     Plan:  H&P condition reviewed and recommended shockwave therapy and reviewed shockwave therapy with patient going over what would be done.  Patient wants to undergo this treatment understanding risk and is scheduled for shockwave and will bring her boot with her next week

## 2017-12-25 ENCOUNTER — Other Ambulatory Visit: Payer: 59

## 2018-01-02 ENCOUNTER — Encounter: Payer: Self-pay | Admitting: Physician Assistant

## 2018-01-11 ENCOUNTER — Other Ambulatory Visit: Payer: Self-pay | Admitting: Gastroenterology

## 2018-01-11 DIAGNOSIS — R1012 Left upper quadrant pain: Secondary | ICD-10-CM

## 2018-01-11 DIAGNOSIS — R1011 Right upper quadrant pain: Secondary | ICD-10-CM

## 2018-01-12 ENCOUNTER — Ambulatory Visit
Admission: RE | Admit: 2018-01-12 | Discharge: 2018-01-12 | Disposition: A | Discharge status: Home / Self Care | Payer: 59 | Source: Ambulatory Visit | Attending: Gastroenterology | Admitting: Gastroenterology

## 2018-01-12 DIAGNOSIS — R1012 Left upper quadrant pain: Secondary | ICD-10-CM

## 2018-01-12 DIAGNOSIS — R1011 Right upper quadrant pain: Secondary | ICD-10-CM

## 2018-01-12 MED ORDER — IOPAMIDOL (ISOVUE-300) INJECTION 61%
100.0000 mL | Freq: Once | INTRAVENOUS | Status: AC | PRN
  Administered 2018-01-12: 100 mL via INTRAVENOUS

## 2018-05-21 ENCOUNTER — Ambulatory Visit: Payer: Self-pay

## 2018-05-21 ENCOUNTER — Other Ambulatory Visit: Payer: Self-pay | Admitting: Occupational Medicine

## 2018-05-21 DIAGNOSIS — Z0000 Encounter for general adult medical examination without abnormal findings: Secondary | ICD-10-CM

## 2019-12-17 DIAGNOSIS — F341 Dysthymic disorder: Secondary | ICD-10-CM | POA: Insufficient documentation

## 2020-02-13 IMAGING — CT CT ABD-PELV W/ CM
2 of 5 series · 14 of 46 positions shown, 16 images · IV contrast (iopamidol)
Comparison: 01/05/2017 screening chest CT.

CLINICAL DATA: Bilateral upper quadrant abdominal pain for several
months. Nausea.

EXAM:
CT ABDOMEN AND PELVIS WITH CONTRAST
TECHNIQUE: Multidetector CT imaging of the abdomen and pelvis was performed
using the standard protocol following bolus administration of
intravenous contrast.
Creatinine was obtained on site at [HOSPITAL] at [REDACTED].
Results: Creatinine 0.7 mg/dL.
CONTRAST:  100mL BI65T1-IMM IOPAMIDOL (BI65T1-IMM) INJECTION 61%

[Series 2: abd pelvis 5.00 br40 s3 ax · axial · 0.63mm/px · z∈[+1123,+1513]mm · 11 of 88 slices shown, 13 images]
[im 5/88  soft-tissue]
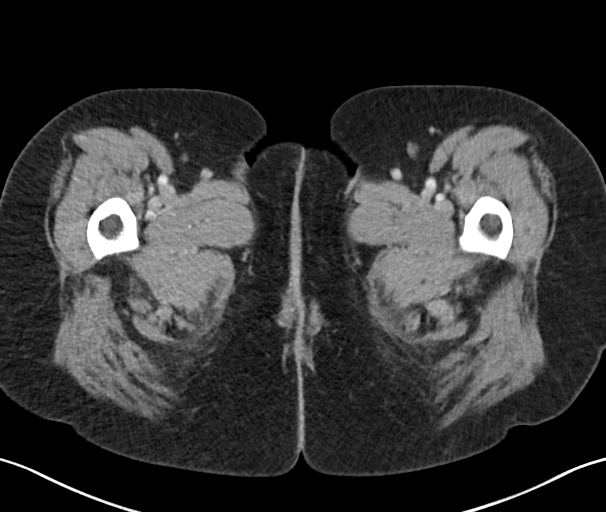
[im 5/88  bone]
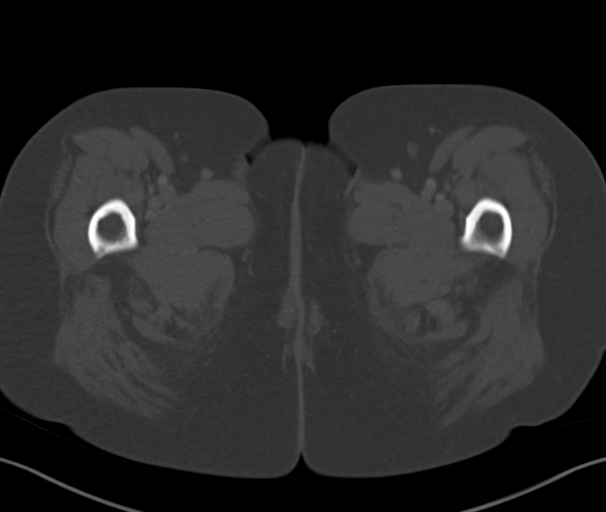
[im 13/88  soft-tissue]
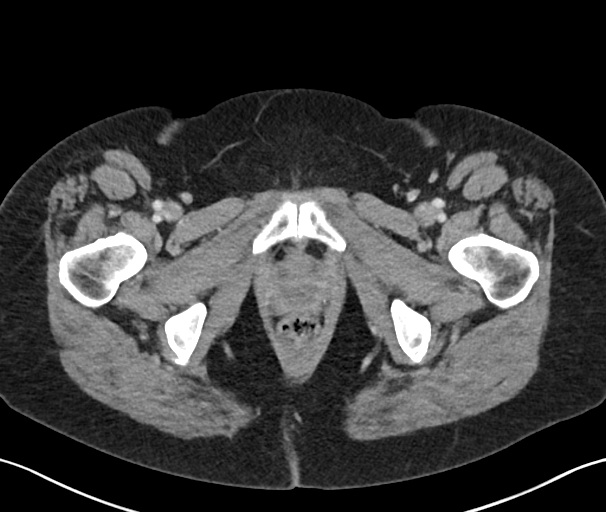
[im 21/88  soft-tissue]
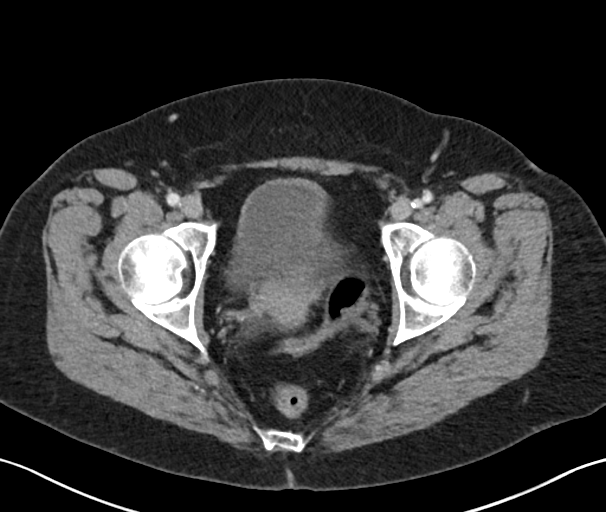
[im 30/88  soft-tissue]
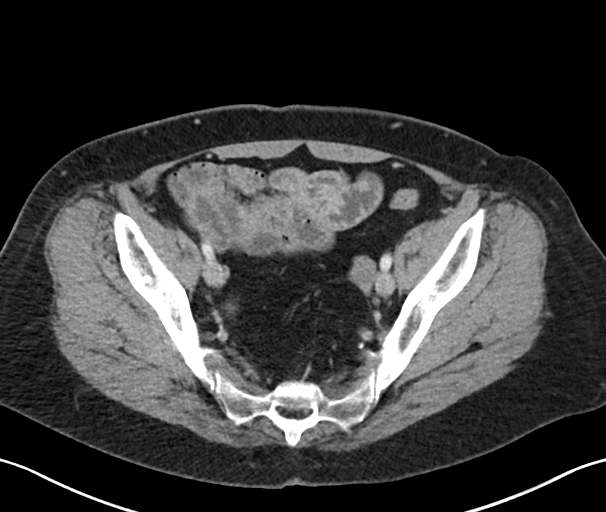
[im 38/88  soft-tissue]
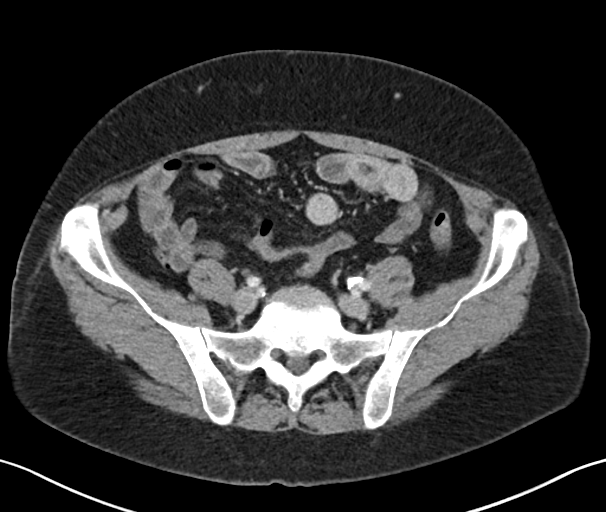
[im 46/88  soft-tissue]
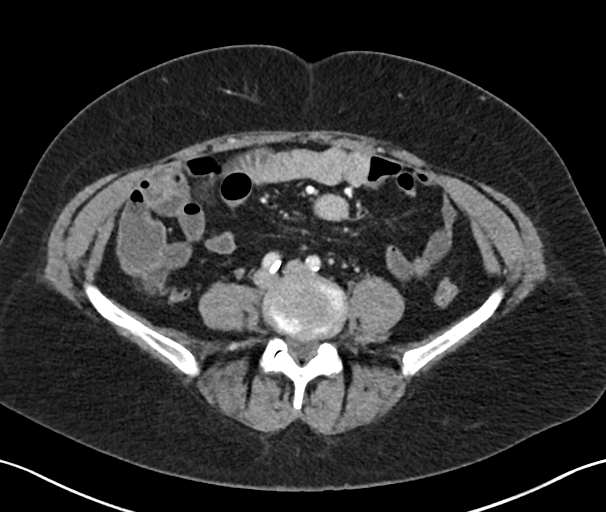
[im 50/88  soft-tissue]
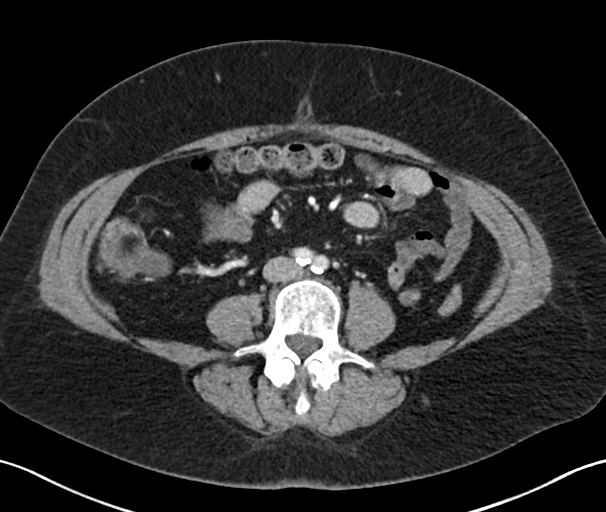
[im 59/88  soft-tissue]
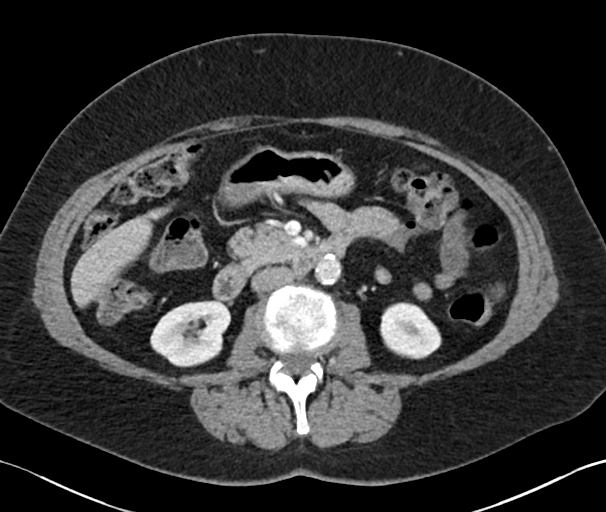
[im 67/88  soft-tissue]
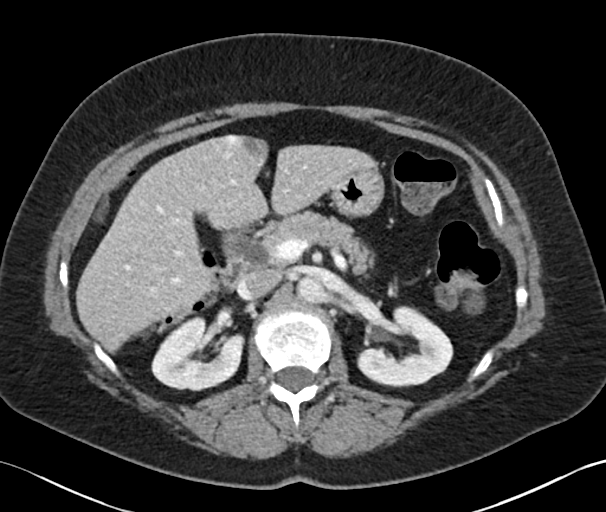
[im 67/88  bone]
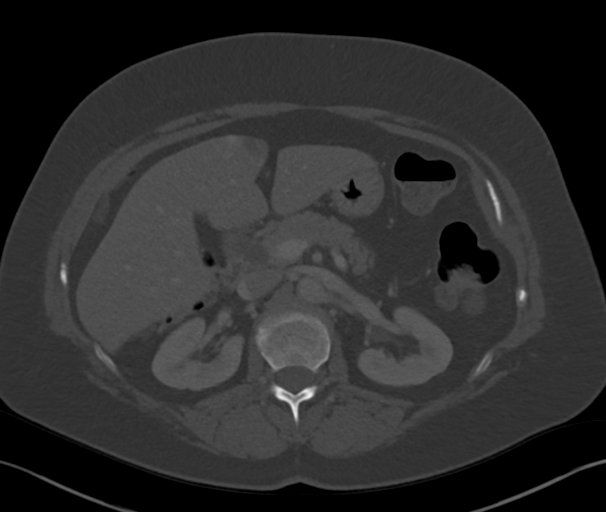
[im 75/88  soft-tissue]
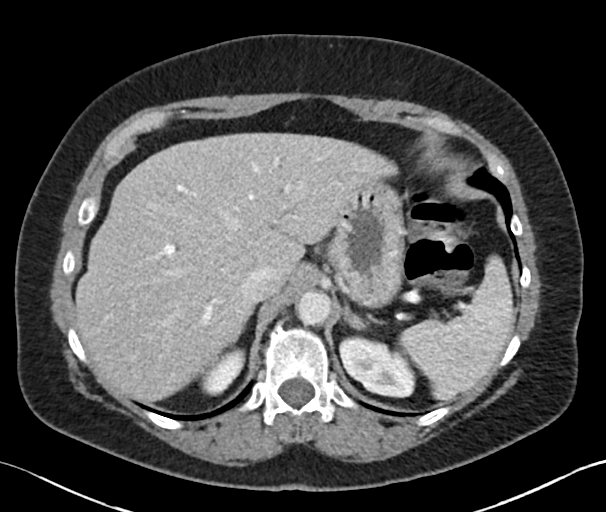
[im 83/88  soft-tissue]
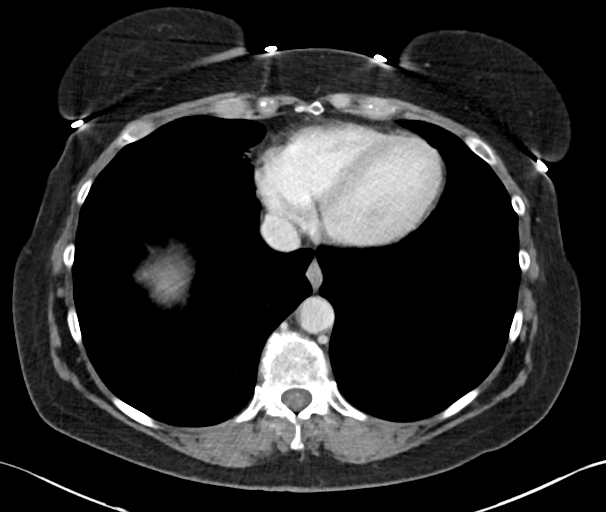

[Series 6: abd pelvis 2.00 br40 s3 cor · coronal · 0.74mm/px · 3 of 158 slices shown]
[im 53/158  soft-tissue]
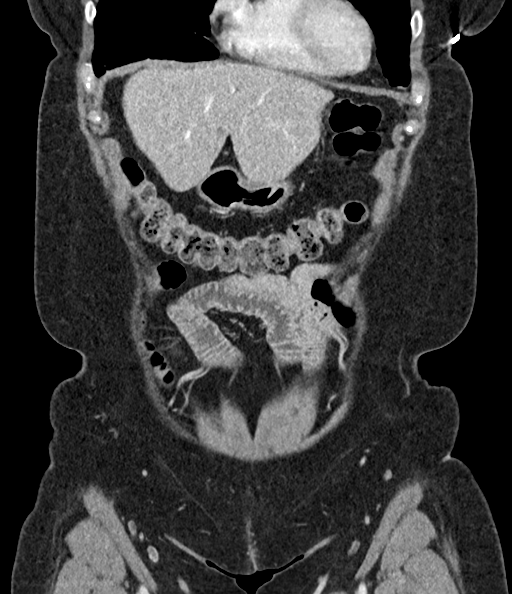
[im 70/158  soft-tissue]
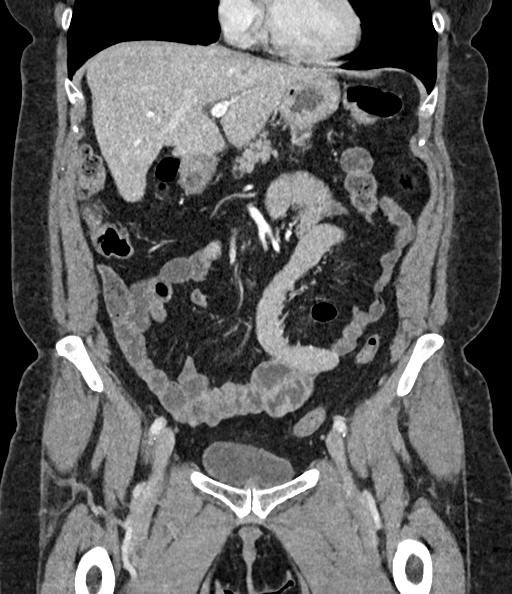
[im 88/158  soft-tissue]
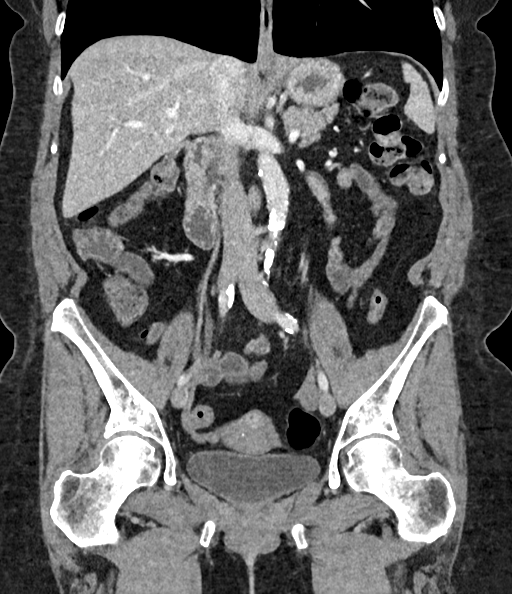

[14 of 46 positions shown; findings below may reference images not displayed]

FINDINGS: Lower chest: No significant pulmonary nodules or acute consolidative
airspace disease.

Hepatobiliary: Normal liver size. Subcentimeter hypodense segment 2
left liver lobe lesion, too small to characterize, for which no
follow-up is required unless the patient has risk factors for liver
malignancy. No additional liver masses. Transient subcentimeter
vascular focus of hyperenhancement in the subcapsular anterior
segment 4B left liver lobe (series 2/image 22).. Cholecystectomy.
Bile ducts are within normal post cholecystectomy limits with CBD
diameter 8 mm.

Pancreas: Normal, with no mass or duct dilation.

Spleen: Normal size. No mass.

Adrenals/Urinary Tract: Normal adrenals. Calyceal diverticulum
measuring 1.2 cm in the upper left kidney (series 11/image 3). A few
small simple left renal cysts, largest 1.9 cm in the posterior upper
left kidney. No suspicious renal masses. No hydronephrosis. Normal
bladder.

Stomach/Bowel: Normal non-distended stomach. Normal caliber small
bowel with no small bowel wall thickening. Normal appendix. Normal
large bowel with no diverticulosis, large bowel wall thickening or
pericolonic fat stranding.

Vascular/Lymphatic: Atherosclerotic nonaneurysmal abdominal aorta.
Patent portal, splenic, hepatic and renal veins. No pathologically
enlarged lymph nodes in the abdomen or pelvis.

Reproductive: Mildly hyperenhancing 1.9 cm left fundal uterine
fibroid. No adnexal masses.

Other: No pneumoperitoneum, ascites or focal fluid collection.

Musculoskeletal: No aggressive appearing focal osseous lesions. Mild
thoracolumbar spondylosis.
IMPRESSION: 1. No acute abnormality. No evidence of bowel obstruction or acute
bowel inflammation. No CT evidence of acute pancreatitis.
2. Small left fundal uterine fibroid.
3. Small calyceal diverticulum in the upper left kidney.
4.  Aortic Atherosclerosis ([id number]).

## 2020-06-21 IMAGING — DX DG CHEST 1V
1 series · 1 of 1 positions shown · non-contrast
Comparison: 12/13/2016 and prior chest radiographs

CLINICAL DATA: Physical examination.  Current smoker.

EXAM:
CHEST  1 VIEW

[chest pa]
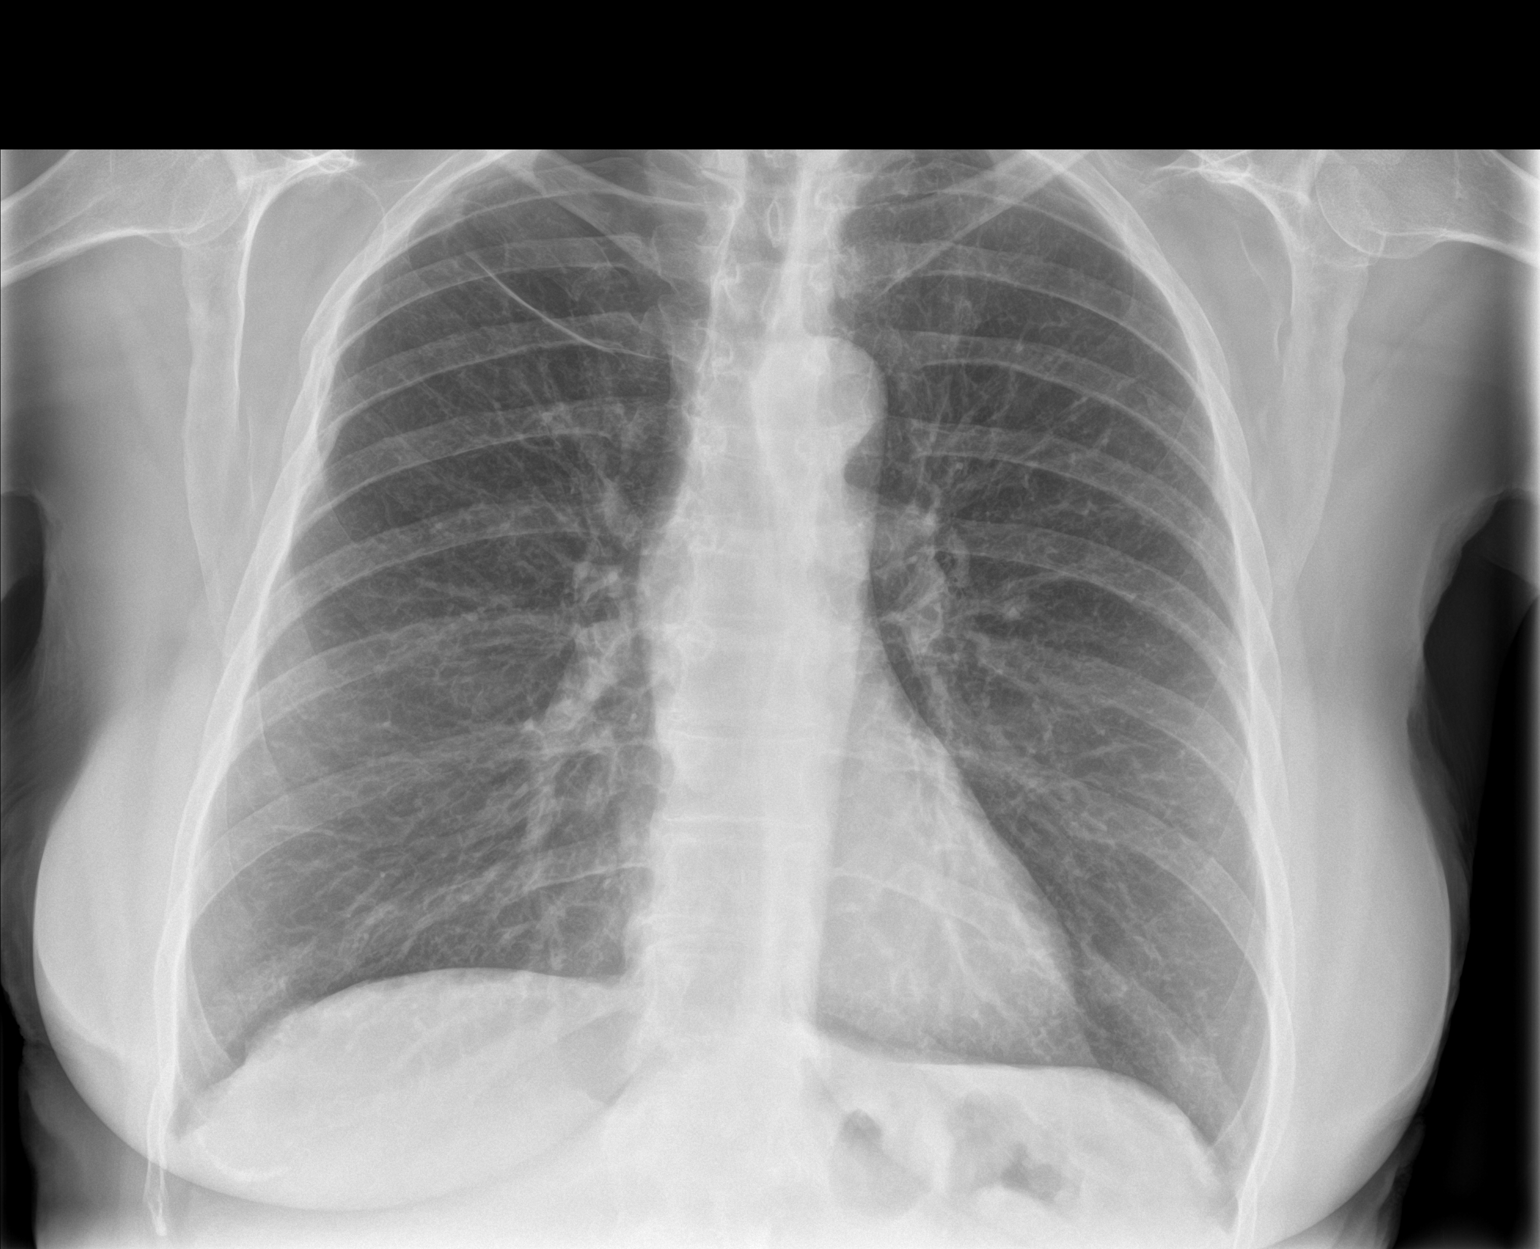

[1 of 1 positions shown; findings below may reference images not displayed]

FINDINGS: The cardiomediastinal silhouette is unremarkable.

There is no evidence of focal airspace disease, pulmonary edema,
suspicious pulmonary nodule/mass, pleural effusion, or pneumothorax.

No acute bony abnormalities are identified.

Remote RIGHT rib fractures again identified.
IMPRESSION: No active disease.

## 2020-11-16 ENCOUNTER — Other Ambulatory Visit: Payer: Self-pay

## 2020-11-16 ENCOUNTER — Encounter (HOSPITAL_BASED_OUTPATIENT_CLINIC_OR_DEPARTMENT_OTHER): Payer: Self-pay

## 2020-11-16 ENCOUNTER — Emergency Department (HOSPITAL_BASED_OUTPATIENT_CLINIC_OR_DEPARTMENT_OTHER)
Admission: EM | Admit: 2020-11-16 | Discharge: 2020-11-16 | Disposition: A | Discharge status: Home / Self Care | Payer: Managed Care, Other (non HMO) | Attending: Emergency Medicine | Admitting: Emergency Medicine

## 2020-11-16 DIAGNOSIS — L539 Erythematous condition, unspecified: Secondary | ICD-10-CM | POA: Insufficient documentation

## 2020-11-16 DIAGNOSIS — J449 Chronic obstructive pulmonary disease, unspecified: Secondary | ICD-10-CM | POA: Insufficient documentation

## 2020-11-16 DIAGNOSIS — F17200 Nicotine dependence, unspecified, uncomplicated: Secondary | ICD-10-CM | POA: Insufficient documentation

## 2020-11-16 DIAGNOSIS — N611 Abscess of the breast and nipple: Secondary | ICD-10-CM | POA: Diagnosis not present

## 2020-11-16 DIAGNOSIS — N644 Mastodynia: Secondary | ICD-10-CM | POA: Diagnosis present

## 2020-11-16 MED ORDER — LIDOCAINE-EPINEPHRINE 1 %-1:100000 IJ SOLN
INTRAMUSCULAR | Status: AC
  Filled 2020-11-16: qty 1

## 2020-11-16 MED ORDER — CEPHALEXIN 500 MG PO CAPS: 500.0000 mg | ORAL_CAPSULE | Freq: Four times a day (QID) | ORAL | 0 refills | Status: AC

## 2020-11-16 NOTE — ED Provider Notes (Signed)
[facility] EMERGENCY DEPARTMENT Provider Note   CSN: [id number] Arrival date & time: 11/16/20  6283     History Chief Complaint  Patient presents with  . Abscess    Connie Shaw is a 65 y.o. female.  HPI Patient is a 65 year old female with a medical history as noted below.  She presents the emergency department due to a point of pain and swelling to the left breast.  Patient states that symptoms started about 1 week ago and began worsening about 3 days ago.  Consistent with prior abscesses.  No fevers, chills, nausea, vomiting.  Patient has no other complaints at this time.    Past Medical History:  Diagnosis Date  . Depression     Patient Active Problem List   Diagnosis Date Noted  . Decreased GFR 02/24/2017  . COPD GOLD II and still smoking 01/13/2017  . Atherosclerosis 01/06/2017  . Incidental lung nodule, greater than or equal to 22mm 01/06/2017  . Smoker unmotivated to quit 08/15/2015  . Stress at home 08/15/2015  . MDD (major depressive disorder), recurrent episode, severe ([place]) 09/01/2014    Past Surgical History:  Procedure Laterality Date  . CHOLECYSTECTOMY    . MOUTH SURGERY       OB History   No obstetric history on file.     Family History  Problem Relation Age of Onset  . Congestive Heart Failure Mother   . Heart disease Mother   . Diabetes Father   . Stroke Father   . Scoliosis Daughter     Social History   Tobacco Use  . Smoking status: Current Every Day Smoker    Packs/day: 1.00    Years: 40.00    Pack years: 40.00  . Smokeless tobacco: Never Used  Substance Use Topics  . Alcohol use: Yes    Comment: occ  . Drug use: Not Currently    Home Medications Prior to Admission medications   Medication Sig Start Date End Date Taking? Authorizing Provider  cephALEXin (KEFLEX) 500 MG capsule Take 1 capsule (500 mg total) by mouth 4 (four) times daily for 7 days. 11/16/20 11/23/20 Yes Rayna Sexton, PA-C  albuterol  (PROVENTIL HFA;VENTOLIN HFA) 108 (90 Base) MCG/ACT inhaler Inhale 2 puffs into the lungs every 4 (four) hours as needed for wheezing or shortness of breath (cough, shortness of breath or wheezing.). 01/06/17   Tereasa Coop, PA-C  ALPRAZolam Duanne Moron) 1 MG tablet Take 1 mg by mouth daily as needed for anxiety.    [provider]  Calcium Carbonate-Vitamin D3 600-400 MG-UNIT TABS Take two tablets by mouth daily. 05/22/17   Tereasa Coop, PA-C  diclofenac (VOLTAREN) 75 MG EC tablet Take 1 tablet (75 mg total) by mouth 2 (two) times daily. 11/23/17   Wallene Huh, DPM  ibuprofen (ADVIL,MOTRIN) 600 MG tablet Take 1 tablet (600 mg total) by mouth every 8 (eight) hours as needed. 09/02/15   Tereasa Coop, PA-C  lisinopril (ZESTRIL) 10 MG tablet Take 10 mg by mouth daily. 11/06/20   [provider]  pravastatin (PRAVACHOL) 20 MG tablet Take 1 tablet (20 mg total) by mouth daily. 05/22/17   Tereasa Coop, PA-C  promethazine-dextromethorphan (PROMETHAZINE-DM) 6.25-15 MG/5ML syrup TAKE 5 ML BY MOUTH AT BEDTIME AS NEEDED FOR COUGH 05/22/17   Tereasa Coop, PA-C    Allergies    Codeine and Pravastatin  Review of Systems   Review of Systems  Constitutional: Negative for chills and fever.  Gastrointestinal:  Negative for nausea and vomiting.  Musculoskeletal: Positive for myalgias.  Skin: Positive for color change and rash.    Physical Exam Updated Vital Signs BP (!) 125/106 (BP Location: Left Arm)   Pulse 72   Temp 98.5 F (36.9 C) (Oral)   Resp 18   Ht 5\' 6"  (1.676 m)   Wt 70.3 kg   SpO2 96%   BMI 25.02 kg/m   Physical Exam Vitals and nursing note reviewed. Exam conducted with a chaperone present.  Constitutional:      General: She is not in acute distress.    Appearance: She is well-developed.  HENT:     Head: Normocephalic and atraumatic.     Right Ear: External ear normal.     Left Ear: External ear normal.  Eyes:     General: No scleral icterus.        Right eye: No discharge.        Left eye: No discharge.     Conjunctiva/sclera: Conjunctivae normal.  Neck:     Trachea: No tracheal deviation.  Cardiovascular:     Rate and Rhythm: Normal rate.  Pulmonary:     Effort: Pulmonary effort is normal. No respiratory distress.     Breath sounds: No stridor.     Comments: 2 to 3 cm region of erythema, mild pain, moderate fluctuance noted along the left medial breast fold.  Consistent with abscess. Abdominal:     General: There is no distension.  Musculoskeletal:        General: Tenderness present. No swelling or deformity.     Cervical back: Neck supple.  Skin:    General: Skin is warm and dry.     Findings: Erythema present. No rash.  Neurological:     Mental Status: She is alert.     Cranial Nerves: Cranial nerve deficit: no gross deficits.    ED Results / Procedures / Treatments   Labs (all labs ordered are listed, but only abnormal results are displayed) Labs Reviewed - No data to display  EKG None  Radiology No results found.  Procedures Procedures   Medications Ordered in ED Medications  lidocaine-EPINEPHrine (XYLOCAINE W/EPI) 1 %-1:100000 (with pres) injection (has no administration in time range)   ED Course  I have reviewed the triage vital signs and the nursing notes.  Pertinent labs & imaging results that were available during my care of the patient were reviewed by me and considered in my medical decision making (see chart for details).    MDM Rules/Calculators/A&P                          Patient is a 65 year old female with a developing abscess to the medial aspect of the left breast.  Physical exam is otherwise reassuring.  No other complaints at this time.  No fevers, chills, nausea, vomiting.  No nipple involvement.  Discussed needle aspiration versus starting patient on antibiotics and outpatient follow-up.  She chose the latter.  We will start patient on a course of Keflex.  We will give patient a  referral to the breast clinic.  Also recommended she follow-up with her GYN if unable to schedule a quick appointment with the breast clinic.  Discussed return precautions.  Her questions were answered and she was amicable at the time of discharge.  Final Clinical Impression(s) / ED Diagnoses Final diagnoses:  Breast abscess   Rx / DC Orders ED Discharge Orders  Ordered    cephALEXin (KEFLEX) 500 MG capsule  4 times daily        11/16/20 2123           Rayna Sexton, PA-C 11/16/20 2125    Tegeler, Gwenyth Allegra, MD 11/17/20 [phone]

## 2020-11-16 NOTE — Discharge Instructions (Signed)
Like we discussed, I have given you a referral to the breast center.  Their contact information is below.  Please give them a call tomorrow to schedule a follow-up appointment.  If you are unable to schedule a timely appointment, consider following up with your GYN.  I prescribed you a medication called Keflex.  This is a strong antibiotic you need to take 4 times a day for the next 7 days.  Please continue to apply warm compresses to the region and take hot showers.  If you develop new or worsening symptoms, please return to the emergency department.  It was a pleasure to meet you.

## 2020-11-16 NOTE — ED Triage Notes (Signed)
Pt c/o ?abscess to left breast x 1 week-NAD-steady gait

## 2023-09-05 NOTE — Progress Notes (Unsigned)
Subjective:     Patient ID: Connie Shaw, female   DOB: 11/11/1955    MRN: [id number]  HPI   31 yowf  Active smoker referred to pulmonary clinic 01/13/2017 by  Deliah Boston PA from [facility] with LCS pos several nodules and ? Of COPD  With GOLD II criteria on eval 01/13/2017     01/13/2017 1st [facility] office visit/ Ramyah Pankowski   Chief Complaint  Patient presents with   Pulmonary Consult    Referred by Deliah Boston, PA for eval of abnormal ct chest.   1st week in March 2018 fell and injured R Chest > CxR abn > CT with sev nodules and rec for 6 m per rad criteria  Doe = MMRC1 = can walk nl pace, flat grade, can't hurry or go uphills or steps s sob   Can't really tell proair helps Rec You have only mild copd and unlikely to progress unless you continue to smoke  Pulmonary follow up is as needed     09/06/2023 Re-establish ov/[place] office/Metha Kolasa re: GOLD 2 copd  maint on rx was on lisinopril  with severe cough resolved on ARB Chief Complaint  Patient presents with   Establish Care   COPD   Cough   Dyspnea:  dancing / shopping / housework  Cough: severe intermittent last episode Sept 2024  neg covid rx  abx / prednisone  09/04/23 woke up with ST / severe cough > thick clear > no sick contacts  Assoc nasal congestion and more sob than usual  Fever one day prior to OV  but no  fever on day of ov and st  Able to lie flat one pillow      No obvious day to day or daytime variability or assoc   mucus plugs or hemoptysis or cp or chest tightness, subjective wheeze or overt sinus or hb symptoms.    Also denies any obvious fluctuation of symptoms with weather or environmental changes or other aggravating or alleviating factors except as outlined above   No unusual exposure hx or h/o childhood pna/ asthma or knowledge of premature birth.  Current Allergies, Complete Past Medical History, Past Surgical History, Family History, and Social History were reviewed in [employer] record.  ROS  The following are not active complaints unless bolded Hoarseness, sore throat, dysphagia, dental problems, itching, sneezing,  nasal congestion or discharge of excess mucus or purulent secretions, ear ache,   fever, chills, sweats, unintended wt loss or wt gain, classically pleuritic or exertional cp,  orthopnea pnd or arm/hand swelling  or leg swelling, presyncope, palpitations, abdominal pain, anorexia, nausea, vomiting, diarrhea  or change in bowel habits or change in bladder habits, change in stools or change in urine, dysuria, hematuria,  rash, arthralgias, visual complaints, headache, numbness, weakness or ataxia or problems with walking or coordination,  change in mood or  memory.        Current Meds - - NOTE:   Unable to verify as accurately reflecting what pt takes    Medication Sig   ALPRAZolam (XANAX) 1 MG tablet Take 1 mg by mouth daily as needed for anxiety.   Calcium Carbonate-Vitamin D3 600-400 MG-UNIT TABS Take two tablets by mouth daily.   ibuprofen (ADVIL,MOTRIN) 600 MG tablet Take 1 tablet (600 mg total) by mouth every 8 (eight) hours as needed.  Objective:   Physical Exam   Wts   09/06/2023        161    01/13/17 195 lb 6.4 oz (88.6 kg)  12/26/16 190 lb 12.8 oz (86.5 kg)  12/13/16 191 lb (86.6 kg)    Vital signs reviewed  09/06/2023  - Note at rest 02 sats  95% on RA   General appearance:    amb healthy appearing wf nad     HEENT : Oropharynx  clear      NECK :  without  apparent JVD/ palpable Nodes/TM    LUNGS: no acc muscle use,  Min barrel  contour chest wall with bilateral  slightly decreased bs s audible wheeze and  without cough on insp or exp maneuvers and min  Hyperresonant  to  percussion bilaterally    CV:  RRR  no s3 or murmur or increase in P2, and no edema   ABD:  soft and nontender with pos end  insp Hoover's  in the supine position.  No bruits or organomegaly appreciated   MS:   Nl gait/ ext warm without deformities Or obvious joint restrictions  calf tenderness, cyanosis or clubbing     SKIN: warm and dry without lesions    NEURO:  alert, approp, nl sensorium with  no motor or cerebellar deficits apparent.          Labs 09/06/2023  Covid 19  screen:  neg     Assessment:

## 2023-09-06 ENCOUNTER — Ambulatory Visit: Payer: Managed Care, Other (non HMO) | Admitting: Internal Medicine

## 2023-09-06 ENCOUNTER — Encounter: Payer: Self-pay | Admitting: Internal Medicine

## 2023-09-06 VITALS — BP 113/67 | HR 74 | Ht 66.0 in | Wt 161.0 lb

## 2023-09-06 DIAGNOSIS — I10 Essential (primary) hypertension: Secondary | ICD-10-CM | POA: Insufficient documentation

## 2023-09-06 DIAGNOSIS — R5081 Fever presenting with conditions classified elsewhere: Secondary | ICD-10-CM

## 2023-09-06 DIAGNOSIS — Z860100 Personal history of colon polyps, unspecified: Secondary | ICD-10-CM | POA: Insufficient documentation

## 2023-09-06 DIAGNOSIS — F17210 Nicotine dependence, cigarettes, uncomplicated: Secondary | ICD-10-CM | POA: Diagnosis not present

## 2023-09-06 DIAGNOSIS — K5900 Constipation, unspecified: Secondary | ICD-10-CM | POA: Insufficient documentation

## 2023-09-06 DIAGNOSIS — R051 Acute cough: Secondary | ICD-10-CM

## 2023-09-06 DIAGNOSIS — K5904 Chronic idiopathic constipation: Secondary | ICD-10-CM | POA: Insufficient documentation

## 2023-09-06 DIAGNOSIS — G4700 Insomnia, unspecified: Secondary | ICD-10-CM | POA: Insufficient documentation

## 2023-09-06 DIAGNOSIS — J449 Chronic obstructive pulmonary disease, unspecified: Secondary | ICD-10-CM | POA: Diagnosis not present

## 2023-09-06 DIAGNOSIS — F419 Anxiety disorder, unspecified: Secondary | ICD-10-CM | POA: Insufficient documentation

## 2023-09-06 LAB — POC COVID19 BINAXNOW: SARS Coronavirus 2 Ag: NEGATIVE

## 2023-09-06 NOTE — Addendum Note (Signed)
Addended by: Sandrea Hughs B on: 09/06/2023 04:18 PM   Modules accepted: Orders

## 2023-09-06 NOTE — Patient Instructions (Addendum)
Try prilosec otc 20mg   Take 30-60 min before first meal of the day and Pepcid ac (famotidine) 20 mg one  after until cough and throat clearing are completely gone for at least a week without the need for cough suppression  Zpak   My office will be contacting you by phone for referral to lung cancer screening program [phone]   - if you don't hear back from my office within one week please call us back or notify us thru MyChart and we'll address it right away.   Pulmonary follow up as long as back to 100% to where you were before the onset

## 2023-09-06 NOTE — Assessment & Plan Note (Signed)
Counseled re importance of smoking cessation but did not meet time criteria for separate billing    Low-dose CT lung cancer screening is recommended for patients who are 65-69 years of age with a 20+ pack-year history of smoking and who are currently smoking or quit <=15 years ago. No coughing up blood  No unintentional weight loss of > 15 pounds in the last 6 months - pt is eligible for scanning yearly until age 69 > referred   Discussed in detail all the  indications, usual  risks and alternatives  relative to the benefits with patient who agrees to proceed with w/u as outlined.            Each maintenance medication was reviewed in detail including emphasizing most importantly the difference between maintenance and prns and under what circumstances the prns are to be triggered using an action plan format where appropriate.  Total time for H and P, chart review, counseling,   and generating customized AVS unique to this office visit / same day charting = 40 min with pt not seen in > 3 y

## 2023-09-06 NOTE — Assessment & Plan Note (Signed)
Active smoker - Spirometry 01/13/2017  FEV1 1.84 (68%)  Ratio 64 p no rx prior    No flare despite typical trigger and no need for even saba so likely still 2 group A symptom / risk and no f/u needed  Rx Zapk Mucinex dm prn   F/u prn < 100 % back to baseline

## 2023-09-07 ENCOUNTER — Telehealth: Payer: Self-pay | Admitting: Internal Medicine

## 2023-09-07 MED ORDER — AZITHROMYCIN 250 MG PO TABS: ORAL_TABLET | ORAL | 0 refills | Status: DC

## 2023-09-07 NOTE — Telephone Encounter (Signed)
Patient states Zpak needs to go to [facility]. Connie Shaw. Patient phone number is [phone].

## 2023-09-07 NOTE — Telephone Encounter (Signed)
Rx sent to pharmacy. Patient advised via MyChart.

## 2023-09-07 NOTE — Telephone Encounter (Signed)
Rx already sent: E-Prescribing Status: Receipt confirmed by pharmacy (09/07/2023 11:08 AM EST)

## 2023-09-07 NOTE — Telephone Encounter (Signed)
Patient' daughter , Blake Divine (on [place]) states the pharmacy did not receive the prescription for Zithromax--please resend to  [facility] in [place]----[phone] daughter's  call back [phone]

## 2023-09-14 ENCOUNTER — Other Ambulatory Visit: Payer: Self-pay | Admitting: Emergency Medicine

## 2023-09-14 DIAGNOSIS — Z122 Encounter for screening for malignant neoplasm of respiratory organs: Secondary | ICD-10-CM

## 2023-09-14 DIAGNOSIS — Z87891 Personal history of nicotine dependence: Secondary | ICD-10-CM

## 2023-09-14 DIAGNOSIS — F17210 Nicotine dependence, cigarettes, uncomplicated: Secondary | ICD-10-CM

## 2023-09-18 ENCOUNTER — Ambulatory Visit: Payer: Managed Care, Other (non HMO) | Admitting: Adult Health

## 2023-09-18 ENCOUNTER — Encounter: Payer: Self-pay | Admitting: Adult Health

## 2023-09-18 DIAGNOSIS — F17210 Nicotine dependence, cigarettes, uncomplicated: Secondary | ICD-10-CM

## 2023-09-18 NOTE — Patient Instructions (Signed)

## 2023-09-18 NOTE — Progress Notes (Signed)
  Virtual Visit via Telephone Note  I connected with Connie Shaw , 09/18/23 10:42 AM by a telemedicine application and verified that I am speaking with the correct person using two identifiers.  Location: Patient: home Provider: home   I discussed the limitations of evaluation and management by telemedicine and the availability of in person appointments. The patient expressed understanding and agreed to proceed.   Shared Decision Making Visit Lung Cancer Screening Program [phone])   Eligibility: 69 y.o. Pack Years Smoking History Calculation = 37 pack years  (# packs/per year x # years smoked) Recent History of coughing up blood  no Unexplained weight loss? no ( >Than 15 pounds within the last 6 months ) Prior History Lung / other cancer no (Diagnosis within the last 5 years already requiring surveillance chest CT Scans). Smoking Status Current Smoker  Visit Components: Discussion included one or more decision making aids. YES Discussion included risk/benefits of screening. YES Discussion included potential follow up diagnostic testing for abnormal scans. YES Discussion included meaning and risk of over diagnosis. YES Discussion included meaning and risk of False Positives. YES Discussion included meaning of total radiation exposure. YES  Counseling Included: Importance of adherence to annual lung cancer LDCT screening. YES Impact of comorbidities on ability to participate in the program. YES Ability and willingness to under diagnostic treatment. YES  Smoking Cessation Counseling: Current Smokers:  Discussed importance of smoking cessation. yes Information about tobacco cessation classes and interventions provided to patient. yes Patient provided with "ticket" for LDCT Scan. yes Symptomatic Patient. no Diagnosis Code: Tobacco Use Z72.0 Asymptomatic Patient yes  Counseling (Intermediate counseling: > three minutes counseling) U0454  Z12.2-Screening of respiratory  organs Z87.891-Personal history of nicotine dependence   Danford Bad 09/18/23

## 2023-10-06 ENCOUNTER — Ambulatory Visit
Admission: RE | Admit: 2023-10-06 | Discharge: 2023-10-06 | Disposition: A | Discharge status: Home / Self Care | Payer: Managed Care, Other (non HMO) | Source: Ambulatory Visit | Attending: Acute Care | Admitting: Acute Care

## 2023-10-06 DIAGNOSIS — F17210 Nicotine dependence, cigarettes, uncomplicated: Secondary | ICD-10-CM

## 2023-10-06 DIAGNOSIS — Z122 Encounter for screening for malignant neoplasm of respiratory organs: Secondary | ICD-10-CM

## 2023-10-06 DIAGNOSIS — Z87891 Personal history of nicotine dependence: Secondary | ICD-10-CM

## 2023-10-11 ENCOUNTER — Telehealth: Payer: Self-pay | Admitting: *Deleted

## 2023-10-11 NOTE — Telephone Encounter (Signed)
 NFN

## 2023-10-11 NOTE — Telephone Encounter (Signed)
 Spoke with patient. Advised no results at this time as they are taking 2-3 weeks to result. Pt verbalized understanding.

## 2023-10-11 NOTE — Telephone Encounter (Signed)
 Call w/CT results. Xray tech said 2-3 days.

## 2023-10-18 ENCOUNTER — Encounter: Payer: Self-pay | Admitting: Internal Medicine

## 2023-10-18 ENCOUNTER — Telehealth: Payer: Self-pay | Admitting: Internal Medicine

## 2023-10-18 NOTE — Telephone Encounter (Signed)
 Patient called again to review results. Advises she has not been recently sick or sick around the time of the scan. Advised once the provider has reviewed the scan we will call her back with results. Patient verbalized understanding.

## 2023-10-18 NOTE — Telephone Encounter (Signed)
 Patient would like to know the results of her CT scan that was taken on January 3rd.  Call back number [phone] or work number [phone]

## 2023-10-19 ENCOUNTER — Telehealth: Payer: Self-pay | Admitting: Acute Care

## 2023-10-19 DIAGNOSIS — J069 Acute upper respiratory infection, unspecified: Secondary | ICD-10-CM

## 2023-10-19 DIAGNOSIS — Z87891 Personal history of nicotine dependence: Secondary | ICD-10-CM

## 2023-10-19 DIAGNOSIS — R911 Solitary pulmonary nodule: Secondary | ICD-10-CM

## 2023-10-19 MED ORDER — DOXYCYCLINE HYCLATE 100 MG PO TABS: 100.0000 mg | ORAL_TABLET | Freq: Two times a day (BID) | ORAL | 0 refills | Status: DC

## 2023-10-19 NOTE — Telephone Encounter (Signed)
  I have called the patient with results  the low dose CT Chest. The scan was read as a LR 0, she had Development of innumerable pulmonary nodules and areas of nodular consolidation, favored to be infectious/inflammatory. Consider antibiotic therapy and lung cancer screening CT follow-up at 6-12 weeks. She states she has had an upper respiratory infection about 3 weeks prior to the scan. Plan is to treat with Doxycycline 100 mg BID x 7 days. I have asked her to take a probiotic while she is on antibiotic. Follow up LDCT in 8 weeks from date of initial scan ( 10/06/2023) to re-evaluate. I have send in the Doxycycline. She understands she will get a call to schedule the CT Chest.   Jonita Albee and Revonda Standard, please fax results to PCP and let them know plan for follow up. Thanks so much.

## 2023-10-19 NOTE — Telephone Encounter (Signed)
Pt does not have PCP listed on file. CT results/ plans sent to Dr Sherene Sires. Order placed for 8 week follow up CT.

## 2023-11-10 ENCOUNTER — Telehealth (HOSPITAL_COMMUNITY): Payer: Self-pay | Admitting: Cardiology

## 2023-11-10 NOTE — Telephone Encounter (Signed)
 Patient called to check the status of her cardiology referral  Report her PCP Dr Samie referred her to Dr Cherrie  -current referral in place for general cards Reason for referral not appropriate for [facility] clinic  Pt aware [facility] should be calling with an appointment

## 2023-11-17 ENCOUNTER — Encounter: Payer: Self-pay | Admitting: Internal Medicine

## 2023-11-17 ENCOUNTER — Ambulatory Visit (HOSPITAL_COMMUNITY)
Admission: RE | Admit: 2023-11-17 | Discharge: 2023-11-17 | Disposition: A | Discharge status: Home / Self Care | Payer: Managed Care, Other (non HMO) | Source: Ambulatory Visit | Attending: Internal Medicine | Admitting: Internal Medicine

## 2023-11-17 ENCOUNTER — Other Ambulatory Visit: Payer: Self-pay

## 2023-11-17 ENCOUNTER — Ambulatory Visit: Payer: Managed Care, Other (non HMO) | Admitting: Internal Medicine

## 2023-11-17 VITALS — BP 149/83 | HR 72 | Ht 66.0 in | Wt 156.0 lb

## 2023-11-17 DIAGNOSIS — R058 Other specified cough: Secondary | ICD-10-CM

## 2023-11-17 DIAGNOSIS — J449 Chronic obstructive pulmonary disease, unspecified: Secondary | ICD-10-CM | POA: Diagnosis not present

## 2023-11-17 DIAGNOSIS — F17210 Nicotine dependence, cigarettes, uncomplicated: Secondary | ICD-10-CM | POA: Diagnosis not present

## 2023-11-17 DIAGNOSIS — R911 Solitary pulmonary nodule: Secondary | ICD-10-CM | POA: Diagnosis not present

## 2023-11-17 MED ORDER — ALBUTEROL SULFATE HFA 108 (90 BASE) MCG/ACT IN AERS: 2.0000 | INHALATION_SPRAY | Freq: Four times a day (QID) | RESPIRATORY_TRACT | 5 refills | Status: DC | PRN

## 2023-11-17 MED ORDER — ALBUTEROL SULFATE HFA 108 (90 BASE) MCG/ACT IN AERS: INHALATION_SPRAY | RESPIRATORY_TRACT | 11 refills | Status: DC

## 2023-11-17 NOTE — Patient Instructions (Addendum)
The key is to stop smoking completely before smoking completely stops you!   My office will be contacting you by phone for referral to [facility]   - if you don't hear back from my office within one week please call us back or notify us thru MyChart and we'll address it right away.   For cough / congestion > mucinex dm or Mucinex D (behind counter/have to sign) as needed   For wheeze > albuterol 2 puffs evey 4 hours as needed   Please remember to go to the lab department   for your tests - we will call you with the results when they are available.     Please remember to go to the  x-ray department  @  [facility] for your tests - we will call you with the results when they are available      We will cancel the CT for March and see you in 6 weeks -bring inhaler with you

## 2023-11-17 NOTE — Progress Notes (Signed)
Subjective:     Patient ID: Connie Shaw, female   DOB: 18-Mar-1956    MRN: [id number]  HPI   17 yowf  Active smoker referred to pulmonary clinic 01/13/2017 by  Deliah Boston PA from [facility] with LCS pos several nodules and ? Of COPD  With GOLD II criteria on eval 01/13/2017     01/13/2017 1st [facility] visit/ Connie Shaw   Chief Complaint  Patient presents with   Pulmonary Consult    Referred by Deliah Boston, PA for eval of abnormal ct chest.   1st week in March 2018 fell and injured R Chest > CxR abn > CT with sev nodules and rec for 6 m per rad criteria  Doe = MMRC1 = can walk nl pace, flat grade, can't hurry or go uphills or steps s sob   Can't really tell proair helps Rec You have only mild copd and unlikely to progress unless you continue to smoke  Pulmonary follow up is as needed     09/06/2023 Re-establish ov/Connie Shaw/Connie Shaw re: GOLD 2 copd  maint on rx was on lisinopril  with severe cough resolved on ARB Chief Complaint  Patient presents with   Establish Care   COPD   Cough  Dyspnea:  dancing / shopping / housework  Cough: severe intermittent last episode Sept 2024  neg covid rx  abx / prednisone  09/04/23 woke up with ST / severe cough > thick clear > no sick contacts  Assoc nasal congestion and more sob than usual  Fever one day prior to OV  but no  fever on day of ov and st  Able to lie flat one pillow  Rec Try prilosec otc 20mg   Take 30-60 min before first meal of the day and Pepcid ac (famotidine) 20 mg one  after until cough and throat clearing are completely gone for at least a week without the need for cough suppression Zpak  Pulmonary follow up is as needed as long as back to 100% to where you were before the onset     I personally reviewed images and agree with radiology impression as follows:   Chest LDSCT     10/06/23 1. Lung-RADS 0, incomplete. Additional lung cancer screening CT images/or comparison to prior chest CT examinations  is needed. Development of innumerable pulmonary nodules and areas of nodular consolidation, favored to be infectious/inflammatory. Consider antibiotic therapy and lung cancer screening CT follow-up at 6-12 weeks. 2. Aortic atherosclerosis (ICD10-I70.0), coronary artery atherosclerosis and emphysema (ICD10-J43.9).  Rx doxy x 7 days 10/19/23   11/17/2023  f/u ov/Connie Shaw/Connie Shaw re: abn CT  maint on nothing  / still smoking/ does use otc gerd rx for flares of cough and seems to help  Chief Complaint  Patient presents with   COPD   Dyspnea:  still dancing does fine unless sinuses act up then starts cough last bad lepisode clinically at [facility]  Cough:  no am cough  Sleeping: flat bed one pillow s   resp cc  SABA use: not using it  02: none Lung cancer screening: 10/06/23   New night sweats x 3 weeks   No obvious day to day or daytime variability or assoc excess/ purulent sputum or mucus plugs or hemoptysis or cp or chest tightness, subjective wheeze or overt  hb symptoms.    Also denies any obvious fluctuation of symptoms with weather or environmental changes or other aggravating or alleviating factors except as outlined above  No unusual exposure hx or h/o childhood pna/ asthma or knowledge of premature birth.  Current Allergies, Complete Past Medical History, Past Surgical History, Family History, and Social History were reviewed in [employer] record.  ROS  The following are not active complaints unless bolded Hoarseness, sore throat, dysphagia, dental problems, itching, sneezing,  nasal congestion or discharge of excess mucus or purulent secretions, ear ache,   fever, chills, sweats, unintended wt loss or wt gain, classically pleuritic or exertional cp,  orthopnea pnd or arm/hand swelling  or leg swelling, presyncope, palpitations, abdominal pain, anorexia, nausea, vomiting, diarrhea  or change in bowel habits or change in bladder habits, change in stools  or change in urine, dysuria, hematuria,  rash, arthralgias, visual complaints, headache, numbness, weakness or ataxia or problems with walking or coordination,  change in mood/ anxious or  memory.        Current Meds  Medication Sig   albuterol (PROVENTIL HFA;VENTOLIN HFA) 108 (90 Base) MCG/ACT inhaler Inhale 2 puffs into the lungs every 4 (four) hours as needed for wheezing or shortness of breath (cough, shortness of breath or wheezing.).   ALPRAZolam (XANAX) 1 MG tablet Take 1 mg by mouth daily as needed for anxiety.   ASPIRIN LOW DOSE 81 MG tablet Take 81 mg by mouth daily.   Calcium Carbonate-Vitamin D3 600-400 MG-UNIT TABS Take two tablets by mouth daily.   ibuprofen (ADVIL,MOTRIN) 600 MG tablet Take 1 tablet (600 mg total) by mouth every 8 (eight) hours as needed.   rosuvastatin (CRESTOR) 10 MG tablet Take 10 mg by mouth at bedtime.   valsartan (DIOVAN) 80 MG tablet Take 80 mg by mouth daily.                 Objective:   Physical Exam   Wts  11/17/2023        156  09/06/2023        161    01/13/17 195 lb 6.4 oz (88.6 kg)  12/26/16 190 lb 12.8 oz (86.5 kg)  12/13/16 191 lb (86.6 kg)    Vital signs reviewed  11/17/2023  - Note at rest 02 sats  96% on RA   General appearance:    amb pleasantly anxious elderly wf nad    HEENT : Oropharynx  clear  / mild wheeze with fvc resolves with PLM   NECK :  without  apparent JVD/ palpable Nodes/TM    LUNGS: no acc muscle use,  Min barrel  contour chest wall with bilateral  slightly decreased bs s audible wheeze and  without cough on insp or exp maneuvers and min  Hyperresonant  to  percussion bilaterally    CV:  RRR  no s3 or murmur or increase in P2, and no edema   ABD:  soft and nontender with pos end  insp Hoover's  in the supine position.  No bruits or organomegaly appreciated   MS:  Nl gait/ ext warm without deformities Or obvious joint restrictions  calf tenderness, cyanosis or clubbing     SKIN: warm and dry without lesions     NEURO:  alert, approp, nl sensorium with  no motor or cerebellar deficits apparent.         CXR PA and Lateral:   11/17/2023 :    I personally reviewed images and impression is as follows:     Non specific RN changes     Assessment:

## 2023-11-18 NOTE — Assessment & Plan Note (Signed)

## 2023-11-18 NOTE — Assessment & Plan Note (Addendum)
Active smoker - Spirometry 01/13/2017  FEV1 1.84 (68%)  Ratio 64 p no rx prior  - 11/17/2023  alpha one AT phenotype >>>   Main symptoms are all related to sinus problems, not limited by sob so rec  >>> Sharolyn Douglas prn: Re SABA :  I spent extra time with pt today reviewing appropriate use of albuterol for prn use on exertion with the following points: 1) saba is for relief of sob that does not improve by walking a slower pace or resting but rather if the pt does not improve after trying this first. 2) If the pt is convinced, as many are, that saba helps recover from activity faster then it's easy to tell if this is the case by re-challenging : ie stop, take the inhaler, then p 5 minutes try the exact same activity (intensity of workload) that just caused the symptoms and see if they are substantially diminished or not after saba 3) if there is an activity that reproducibly causes the symptoms, try the saba 15 min before the activity on alternate days   If in fact the saba really does help, then fine to continue to use it prn but advised may need to look closer at the maintenance regimen being used to achieve better control of airways disease with exertion.    >>> ENT consultation

## 2023-11-18 NOTE — Assessment & Plan Note (Addendum)
Low Dose screen 01/05/17 Lung-Rads category 3-S, probably benign findings. Chest LDSCT     10/06/23 1. Lung-RADS 0, incomplete. Additional lung cancer screening CT images/or comparison to prior chest CT examinations is needed. Development of innumerable pulmonary nodules and areas of nodular consolidation, favored to be infectious/inflammatory. Consider antibiotic therapy and lung cancer screening CT follow-up at 6-12 weeks. - 11/17/2023 labs:  Quant TB, Eos, IgE,ESR   Plain cxr non-specific, has already received doxy x 7 d s any purulent sputum so no further rx for now   Likely this is all MAI  - This is an extremely common benign condition in the elderly and does not warrant aggressive eval/ rx at this point unless there is a clinical correlation suggesting unaddressed pulmonary infection (purulent sputum, night sweats, unintended wt loss, doe) or evolution of  obvious changes on plain cxr (as opposed to serial CT, which is way over sensitive to make clinical decisions re intervention and treatment in the elderly, who tend to tolerate both dx and treatment poorly) .  Likely would need fob for definitive dx so hold off for now.    >>> lab as above/ f/u 6 weeks, sooner if needed    Discussed in detail all the  indications, usual  risks and alternatives  relative to the benefits with patient who agrees to proceed with conservative f/u as outlined           Each maintenance medication was reviewed in detail including emphasizing most importantly the difference between maintenance and prns and under what circumstances the prns are to be triggered using an action plan format where appropriate.  Total time for H and P, chart review, counseling, reviewing hfa  device(s) and generating customized AVS unique to this office visit / same day charting > 30 min for multiple  refractory respiratory  symptoms of uncertain etiology

## 2023-11-22 ENCOUNTER — Telehealth: Payer: Self-pay | Admitting: Internal Medicine

## 2023-11-22 NOTE — Telephone Encounter (Signed)
LVM for patient to call and discuss scheduling the April follow up appointment with Dr. Wynell Balloon prefers [place] location

## 2023-11-22 NOTE — Telephone Encounter (Signed)
Patient is requesting x-ray results from last week---[phone]

## 2023-11-23 NOTE — Telephone Encounter (Signed)
CXR not read by radiology yet.   LVM to advise, also sent [facility] message.

## 2023-11-27 ENCOUNTER — Telehealth: Payer: Self-pay | Admitting: Internal Medicine

## 2023-11-27 NOTE — Telephone Encounter (Signed)
 Patient is inquiring bout her chest x-ray results [phone]

## 2023-11-28 LAB — CBC WITH DIFFERENTIAL/PLATELET
Basophils Absolute: 0.1 10*3/uL (ref 0.0–0.2)
Basos: 1 %
EOS (ABSOLUTE): 0.2 10*3/uL (ref 0.0–0.4)
Eos: 3 %
Hematocrit: 44.1 % (ref 34.0–46.6)
Hemoglobin: 14.4 g/dL (ref 11.1–15.9)
Immature Grans (Abs): 0 10*3/uL (ref 0.0–0.1)
Immature Granulocytes: 0 %
Lymphocytes Absolute: 2.6 10*3/uL (ref 0.7–3.1)
Lymphs: 43 %
MCH: 29.6 pg (ref 26.6–33.0)
MCHC: 32.7 g/dL (ref 31.5–35.7)
MCV: 91 fL (ref 79–97)
Monocytes Absolute: 0.4 10*3/uL (ref 0.1–0.9)
Monocytes: 7 %
Neutrophils Absolute: 2.8 10*3/uL (ref 1.4–7.0)
Neutrophils: 46 %
Platelets: 300 10*3/uL (ref 150–450)
RBC: 4.87 x10E6/uL (ref 3.77–5.28)
RDW: 13.3 % (ref 11.7–15.4)
WBC: 6.1 10*3/uL (ref 3.4–10.8)

## 2023-11-28 LAB — QUANTIFERON-TB GOLD PLUS
QuantiFERON Mitogen Value: >10 [IU]/mL
QuantiFERON Nil Value: 0.05 [IU]/mL
QuantiFERON TB1 Ag Value: 0.1 [IU]/mL
QuantiFERON TB2 Ag Value: 0.14 [IU]/mL
QuantiFERON-TB Gold Plus: NEGATIVE

## 2023-11-28 LAB — ALPHA-1-ANTITRYPSIN PHENOTYP: A-1 Antitrypsin: 158 mg/dL (ref 101–187)

## 2023-11-28 LAB — SEDIMENTATION RATE: Sed Rate: 3 mm/h (ref 0–40)

## 2023-11-28 LAB — IGE: IgE (Immunoglobulin E), Serum: 15 [IU]/mL (ref 6–495)

## 2023-11-29 NOTE — Telephone Encounter (Signed)
 Pt is requesting her results of her xray ,this hasn't been read yet.

## 2023-11-29 NOTE — Telephone Encounter (Signed)
Called and lvm for patient to call us back. 

## 2023-11-29 NOTE — Telephone Encounter (Signed)
 Called  and spoke with pt informed pt of her results,advised pt that once we get the radiology report will call her . Pt stated that she understood

## 2024-01-10 ENCOUNTER — Institutional Professional Consult (permissible substitution) (INDEPENDENT_AMBULATORY_CARE_PROVIDER_SITE_OTHER): Payer: Managed Care, Other (non HMO) | Admitting: Otolaryngology

## 2024-01-23 ENCOUNTER — Telehealth: Payer: Self-pay

## 2024-01-23 NOTE — Telephone Encounter (Signed)
 Copied from CRM [id number]. Topic: General - Other >> Jan 23, 2024  3:53 PM Juliana Ocean wrote: Reason for CRM: pt states she thinks Dr Waymond Hailey wants her to have CT scan prior to her appt. Pt had appt for Thursday, but cancelled until she has order for a CT scan and has it done. Can Dr put in the order for her to have a CT scan? Pt has rescheduled her appt for July 17.

## 2024-01-23 NOTE — Telephone Encounter (Signed)
 Per last office note from Dr. Waymond Hailey, we were canceling the CT for march and following up in 6 weeks which is the appointment that was canceled.  Called and spoke with patient to explain this. Patient put back on schedule as original-pt understood

## 2024-01-24 NOTE — Progress Notes (Unsigned)
 Subjective:     Patient ID: Connie Shaw, female   DOB: 1955/12/03    MRN: [id number]  HPI  89 yowf  Active smoker referred to pulmonary clinic 01/13/2017 by  Noland Battles PA from [facility] with LCS pos several nodules and ? Of COPD  With GOLD II criteria on eval 01/13/2017     01/13/2017 1st [facility] office visit/ Beatrix Breece   Chief Complaint  Patient presents with   Pulmonary Consult    Referred by Noland Battles, PA for eval of abnormal ct chest.   1st week in March 2018 fell and injured R Chest > CxR abn > CT with sev nodules and rec for 6 m per rad criteria  Doe = MMRC1 = can walk nl pace, flat grade, can't hurry or go uphills or steps s sob   Can't really tell proair  helps Rec You have only mild copd and unlikely to progress unless you continue to smoke  Pulmonary follow up is as needed     09/06/2023 Re-establish ov/[place] office/Pepper [place]dham re: GOLD 2 copd  maint on rx was on lisinopril   with severe cough resolved on ARB Chief Complaint  Patient presents with   Establish Care   COPD   Cough  Dyspnea:  dancing / shopping / housework  Cough: severe intermittent last episode Sept 2024  neg covid rx  abx / prednisone   09/04/23 woke up with ST / severe cough > thick clear > no sick contacts  Assoc nasal congestion and more sob than usual  Fever one day prior to OV  but no  fever on day of ov and st  Able to lie flat one pillow  Rec Try prilosec otc 20mg   Take 30-60 min before first meal of the day and Pepcid ac (famotidine) 20 mg one  after until cough and throat clearing are completely gone for at least a week without the need for cough suppression Zpak  Pulmonary follow up is as needed as long as back to 100% to where you were before the onset     I personally reviewed images and agree with radiology impression as follows:   Chest LDSCT     10/06/23 1. Lung-RADS 0, incomplete. Additional lung cancer screening CT images/or comparison to prior chest CT examinations  is needed. Development of innumerable pulmonary nodules and areas of nodular consolidation, favored to be infectious/inflammatory. Consider antibiotic therapy and lung cancer screening CT follow-up at 6-12 weeks. 2. Aortic atherosclerosis (ICD10-I70.0), coronary artery atherosclerosis and emphysema (ICD10-J43.9).  Rx doxy x 7 days 10/19/23   11/17/2023  f/u ov/[place] office/[place] re: abn CT  maint on nothing  / still smoking/ does use otc gerd rx for flares of cough and seems to help  Chief Complaint  Patient presents with   COPD   Dyspnea:  still dancing does fine unless sinuses act up then starts cough last bad  episode clinically at [facility]  Cough:  no am cough  Sleeping: flat bed one pillow s   resp cc  SABA use: not using it  02: none Lung cancer screening: 10/06/23  New night sweats x 3 weeks rec The key is to stop smoking completely before smoking completely stops you! My office will be contacting you by phone for referral to [facility]  > not done as of 01/25/2024  > declined  For cough / congestion > mucinex  dm or Mucinex  D (behind counter/have to sign) as needed  For wheeze > albuterol  2 puffs  evey 4 hours as needed  - 11/17/2023  alpha one AT phenotype >>>  MM  level 158  / IgE 15  Eos 0.2  - cxr ok   We will cancel the CT for March and see you in 6 weeks -bring inhaler with you    01/25/2024  f/u ov/[place] office/Hisae Decoursey re: GOLD 2 copd  maint on saba prn  did  bring inhaler but very poor hfa technique still smoking  Chief Complaint  Patient presents with   COPD  Dyspnea:  still dancing rarely / flat surface fast   Cough: none  Sleeping: flat bed one pillow  resp cc  SABA use: rarely use it  02: none   Lung cancer screening: due now    No obvious day to day or daytime variability or assoc excess/ purulent sputum or mucus plugs or hemoptysis or cp or chest tightness, subjective wheeze or overt sinus or hb symptoms.    Also denies any obvious fluctuation of  symptoms with weather or environmental changes or other aggravating or alleviating factors except as outlined above   No unusual exposure hx or h/o childhood pna/ asthma or knowledge of premature birth.  Current Allergies, Complete Past Medical History, Past Surgical History, Family History, and Social History were reviewed in [employer] record.  ROS  The following are not active complaints unless bolded Hoarseness, sore throat, dysphagia, dental problems, itching, sneezing,  nasal congestion or discharge of excess mucus or purulent secretions, ear ache,   fever, chills, sweats, unintended wt loss or wt gain, classically pleuritic or exertional cp,  orthopnea pnd or arm/hand swelling  or leg swelling, presyncope, palpitations, abdominal pain, anorexia, nausea, vomiting, diarrhea  or change in bowel habits or change in bladder habits, change in stools or change in urine, dysuria, hematuria,  rash, arthralgias, visual complaints, headache, numbness, weakness or ataxia or problems with walking or coordination,  change in mood or  memory.        Current Meds  Medication Sig   albuterol  (VENTOLIN  HFA) 108 (90 Base) MCG/ACT inhaler Inhale 2 puffs into the lungs every 6 (six) hours as needed for wheezing or shortness of breath.   ALPRAZolam (XANAX) 1 MG tablet Take 1 mg by mouth daily as needed for anxiety.   ASPIRIN LOW DOSE 81 MG tablet Take 81 mg by mouth daily.   Calcium  Carbonate-Vitamin D3 600-400 MG-UNIT TABS Take two tablets by mouth daily.   ibuprofen  (ADVIL ,MOTRIN ) 600 MG tablet Take 1 tablet (600 mg total) by mouth every 8 (eight) hours as needed.   rosuvastatin  (CRESTOR ) 10 MG tablet Take 10 mg by mouth at bedtime.   valsartan (DIOVAN) 80 MG tablet Take 80 mg by mouth daily.                Objective:   Physical Exam   Wts  01/25/2024        159  11/17/2023        156  09/06/2023        161    01/13/17 195 lb 6.4 oz (88.6 kg)  12/26/16 190 lb 12.8 oz (86.5  kg)  12/13/16 191 lb (86.6 kg)    Vital signs reviewed  01/25/2024  - Note at rest 02 sats  93% on RA   General appearance:    amb pleasant wf  nad   HEENT : Oropharynx  clear  Nasal turbinates nl   NECK :  without  apparent JVD/ palpable Nodes/TM  LUNGS: no acc muscle use,  Min barrel  contour chest wall with bilateral  slightly decreased bs s audible wheeze and  without cough on insp or exp maneuvers and min  Hyperresonant  to  percussion bilaterally    CV:  RRR  no s3 or murmur or increase in P2, and no edema   ABD:  soft and nontender with pos end  insp Hoover's  in the supine position.  No bruits or organomegaly appreciated   MS:  Nl gait/ ext warm without deformities Or obvious joint restrictions  calf tenderness, cyanosis or clubbing     SKIN: warm and dry without lesions    NEURO:  alert, approp, nl sensorium with  no motor or cerebellar deficits apparent.             CXR PA and Lateral:   11/17/2023 :    I personally reviewed images and impression is as follows:     Non specific RN changes     Assessment:

## 2024-01-25 ENCOUNTER — Encounter: Payer: Self-pay | Admitting: Internal Medicine

## 2024-01-25 ENCOUNTER — Ambulatory Visit: Admitting: Internal Medicine

## 2024-01-25 ENCOUNTER — Ambulatory Visit: Payer: Managed Care, Other (non HMO) | Admitting: Internal Medicine

## 2024-01-25 ENCOUNTER — Other Ambulatory Visit: Payer: Self-pay | Admitting: Emergency Medicine

## 2024-01-25 VITALS — BP 155/81 | HR 75 | Ht 66.0 in | Wt 159.5 lb

## 2024-01-25 DIAGNOSIS — F17210 Nicotine dependence, cigarettes, uncomplicated: Secondary | ICD-10-CM | POA: Diagnosis not present

## 2024-01-25 DIAGNOSIS — R911 Solitary pulmonary nodule: Secondary | ICD-10-CM

## 2024-01-25 DIAGNOSIS — J449 Chronic obstructive pulmonary disease, unspecified: Secondary | ICD-10-CM | POA: Diagnosis not present

## 2024-01-25 NOTE — Assessment & Plan Note (Addendum)
 Low Dose screen 01/05/17 Lung-Rads category 3-S, probably benign findings. Chest LDSCT     10/06/23 1. Lung-RADS 0, incomplete. Additional lung cancer screening CT images/or comparison to prior chest CT examinations is needed. Development of innumerable pulmonary nodules and areas of nodular consolidation, favored to be infectious/inflammatory. Consider antibiotic therapy and lung cancer screening CT follow-up at 6-12 weeks. - 11/17/2023 labs:  Quant TB neg , Eos 0.2,   IgE 15,  ESR 3 - resume LDSCT 01/25/2024 >>>   Low-dose CT lung cancer screening is recommended for patients who are 6-91 years of age with a 20+ pack-year history of smoking and who are currently smoking or quit <=15 years ago. No coughing up blood  No unintentional weight loss of > 15 pounds in the last 6 months - pt is eligible for scanning yearly until age 46 > resume now that not "getting over bronchitis"   Discussed in detail all the  indications, usual  risks and alternatives  relative to the benefits with patient who agrees to proceed with w/u as outlined.             Each maintenance medication was reviewed in detail including emphasizing most importantly the difference between maintenance and prns and under what circumstances the prns are to be triggered using an action plan format where appropriate.  Total time for H and P, chart review, counseling, reviewing hfa device(s) and generating customized AVS unique to this office visit / same day charting = 30 min

## 2024-01-25 NOTE — Assessment & Plan Note (Signed)
 Active smoker/MM - Spirometry 01/13/2017  FEV1 1.84 (68%)  Ratio 64 p no rx prior   - 11/17/2023  alpha one AT phenotype >>>  MM  level 158  / IgE 15  Eos 0.2  - 01/25/2024  After extensive coaching inhaler device,  effectiveness =    50% (consistently late trigger)   Pt is Group A in terms of symptom/risk and saba prn therefore appropriate rx at this point >>>  continue alb but work on technique with / without spacer as needed

## 2024-01-25 NOTE — Patient Instructions (Addendum)
 The key is to stop smoking completely before smoking completely stops you!  My office will be contacting you by phone for referral to lung cancer screening   [phone]) - if you don't hear back from my office within one week,  please call us  back or notify us  thru MyChart and we'll address it right away.     Only use your albuterol  as a rescue medication to be used if you can't catch your breath by resting or doing a relaxed purse lip breathing pattern.  - The less you use it, the better it will work when you need it. - Ok to use up to 2 puffs  every 4 hours if you must but call for immediate appointment if use goes up over your usual need - Don't leave home without it !!  (think of it like the spare tire for your car)   Also  Ok to try albuterol  15 min before an activity (on alternating days)  that you know would usually make you short of breath and see if it makes any difference and if makes none then don't take albuterol  after activity unless you can't catch your breath as this means it's the resting that helps, not the albuterol .       Work on inhaler technique:  relax and gently blow all the way out then take a nice smooth full deep breath back in, triggering the inhaler at same time you start breathing in.  Hold breath in for at least  5 seconds if you can.

## 2024-01-25 NOTE — Assessment & Plan Note (Signed)

## 2024-02-16 ENCOUNTER — Ambulatory Visit
Admission: RE | Admit: 2024-02-16 | Discharge: 2024-02-16 | Disposition: A | Discharge status: Home / Self Care | Source: Ambulatory Visit | Attending: Acute Care | Admitting: Acute Care

## 2024-02-16 DIAGNOSIS — R911 Solitary pulmonary nodule: Secondary | ICD-10-CM

## 2024-02-20 ENCOUNTER — Ambulatory Visit: Admitting: Internal Medicine

## 2024-02-20 ENCOUNTER — Encounter: Payer: Self-pay | Admitting: Internal Medicine

## 2024-02-20 VITALS — BP 122/80 | HR 94 | Temp 98.1°F | Resp 18 | Ht 66.0 in | Wt 154.5 lb

## 2024-02-20 DIAGNOSIS — R0981 Nasal congestion: Secondary | ICD-10-CM | POA: Diagnosis not present

## 2024-02-20 DIAGNOSIS — Z6824 Body mass index (BMI) 24.0-24.9, adult: Secondary | ICD-10-CM | POA: Diagnosis not present

## 2024-02-20 DIAGNOSIS — J40 Bronchitis, not specified as acute or chronic: Secondary | ICD-10-CM | POA: Insufficient documentation

## 2024-02-20 LAB — POC COVID19 BINAXNOW: SARS Coronavirus 2 Ag: NEGATIVE

## 2024-02-20 MED ORDER — FEXOFENADINE HCL 180 MG PO TABS: 180.0000 mg | ORAL_TABLET | Freq: Every day | ORAL | 0 refills | Status: DC

## 2024-02-20 MED ORDER — AZITHROMYCIN 250 MG PO TABS: ORAL_TABLET | ORAL | 0 refills | Status: AC

## 2024-02-20 MED ORDER — OXYMETAZOLINE HCL 0.05 % NA SOLN: 1.0000 | Freq: Two times a day (BID) | NASAL | 0 refills | Status: AC

## 2024-02-20 MED ORDER — GUAIFENESIN ER 600 MG PO TB12: 600.0000 mg | ORAL_TABLET | Freq: Two times a day (BID) | ORAL | 0 refills | Status: DC

## 2024-02-20 NOTE — Assessment & Plan Note (Signed)
 She will take Afrin nasal spray twice a day, Mucinex  600 mg twice a day and fexofenadine 1 tablets daily for 2 weeks.  Her symptoms are getting worse after 1 week and she has history of COPD so I will add Z-Pak as well.  If she is not better she will call  Her primary care doctor or come here.  Her COVID test is negative.

## 2024-02-20 NOTE — Progress Notes (Signed)
   Acute Office Visit  Subjective:     Patient ID: Connie Shaw, female    DOB: 12/25/1955, 68 y.o.   MRN: [id number]  Chief Complaint  Patient presents with   Sinus Problem    HPI Patient is in today for  Cough, running nose and nasal congestion for 1 week.  She says that she is taking over the counter medication including Mucinex  D but it is not helping.  She says that when she bent forward secretions are keep 0 ring out.  She has mild COPD and worried that infection might be going to her lung because she has worsening of cough and cannot sleep because of that.  No fever or chills.  She is bringing yellow colored phlegm.  Her COVID test is negative.  She denies any seasonal allergies.  Review of Systems  Constitutional: Negative.   HENT:  Positive for congestion and sinus pain.   Respiratory:  Positive for cough.         Objective:    BP 122/80 (BP Location: Left Arm, Patient Position: Sitting, Cuff Size: Normal)   Pulse 94   Temp 98.1 F (36.7 C)   Resp 18   Ht 5\' 6"  (1.676 m)   Wt 154 lb 8 oz (70.1 kg)   SpO2 98%   BMI 24.94 kg/m    Physical Exam Constitutional:      Appearance: Normal appearance.  HENT:     Head: Normocephalic and atraumatic.  Cardiovascular:     Rate and Rhythm: Normal rate and regular rhythm.     Heart sounds: Normal heart sounds.  Pulmonary:     Effort: Pulmonary effort is normal.     Breath sounds: Normal breath sounds.  Neurological:     Mental Status: She is alert.     No results found for any visits on 02/20/24.      Assessment & Plan:   Problem List Items Addressed This Visit       Respiratory   Bronchitis     She will take Afrin nasal spray twice a day, Mucinex  600 mg twice a day and fexofenadine 1 tablets daily for 2 weeks.  Her symptoms are getting worse after 1 week and she has history of COPD so I will add Z-Pak as well.  If she is not better she will call  Her primary care doctor or come here.  Her COVID test is  negative.      Sinus congestion - Primary   Relevant Orders   POC COVID-19 BinaxNow (Completed)    No orders of the defined types were placed in this encounter.   No follow-ups on file.  Tita Form, MD

## 2024-02-22 ENCOUNTER — Ambulatory Visit (HOSPITAL_COMMUNITY): Payer: Self-pay

## 2024-03-12 ENCOUNTER — Other Ambulatory Visit: Payer: Self-pay

## 2024-03-12 DIAGNOSIS — F17210 Nicotine dependence, cigarettes, uncomplicated: Secondary | ICD-10-CM

## 2024-03-12 DIAGNOSIS — Z122 Encounter for screening for malignant neoplasm of respiratory organs: Secondary | ICD-10-CM

## 2024-03-12 DIAGNOSIS — Z87891 Personal history of nicotine dependence: Secondary | ICD-10-CM

## 2024-03-19 ENCOUNTER — Ambulatory Visit: Admitting: Internal Medicine

## 2024-03-19 ENCOUNTER — Encounter: Payer: Self-pay | Admitting: Internal Medicine

## 2024-03-19 VITALS — BP 127/82 | HR 87 | Ht 66.0 in | Wt 161.2 lb

## 2024-03-19 DIAGNOSIS — R058 Other specified cough: Secondary | ICD-10-CM | POA: Diagnosis not present

## 2024-03-19 DIAGNOSIS — J449 Chronic obstructive pulmonary disease, unspecified: Secondary | ICD-10-CM

## 2024-03-19 MED ORDER — BUDESONIDE-FORMOTEROL FUMARATE 80-4.5 MCG/ACT IN AERO: INHALATION_SPRAY | RESPIRATORY_TRACT | 12 refills | Status: DC

## 2024-03-19 MED ORDER — BREZTRI AEROSPHERE 160-9-4.8 MCG/ACT IN AERO: 2.0000 | INHALATION_SPRAY | Freq: Two times a day (BID) | RESPIRATORY_TRACT | Status: AC

## 2024-03-19 MED ORDER — OMEPRAZOLE 40 MG PO CPDR: DELAYED_RELEASE_CAPSULE | ORAL | Status: DC

## 2024-03-19 MED ORDER — AZITHROMYCIN 250 MG PO TABS: ORAL_TABLET | ORAL | 0 refills | Status: DC

## 2024-03-19 MED ORDER — PREDNISONE 10 MG PO TABS: ORAL_TABLET | ORAL | 0 refills | Status: DC

## 2024-03-19 NOTE — Progress Notes (Unsigned)
 Subjective:     Patient ID: Connie Shaw, female   DOB: [birth year]    MRN: [id number]  HPI  53 yowf  Active smoker referred to pulmonary clinic 01/13/2017 by  Noland Battles PA from [facility] with LCS pos several nodules and ? Of COPD  With GOLD II criteria on eval 01/13/2017     01/13/2017 1st [facility] office visit/ Mounir Skipper   Chief Complaint  Patient presents with   Pulmonary Consult    Referred by Noland Battles, PA for eval of abnormal ct chest.   1st week in March 2018 fell and injured R Chest > CxR abn > CT with sev nodules and rec for 6 m per rad criteria  Doe = MMRC1 = can walk nl pace, flat grade, can't hurry or go uphills or steps s sob   Can't really tell proair  helps Rec You have only mild copd and unlikely to progress unless you continue to smoke  Pulmonary follow up is as needed     09/06/2023 Re-establish ov/[place] office/Yanessa Hocevar re: GOLD 2 copd  maint on rx was on lisinopril   with severe cough resolved on ARB Chief Complaint  Patient presents with   Establish Care   COPD   Cough  Dyspnea:  dancing / shopping / housework  Cough: severe intermittent last episode Sept 2024  neg covid rx  abx / prednisone   09/04/23 woke up with ST / severe cough > thick clear > no sick contacts  Assoc nasal congestion and more sob than usual  Fever one day prior to OV  but no  fever on day of ov and st  Able to lie flat one pillow  Rec Try prilosec otc 20mg   Take 30-60 min before first meal of the day and Pepcid ac (famotidine) 20 mg one  after until cough and throat clearing are completely gone for at least a week without the need for cough suppression Zpak  Pulmonary follow up is as needed as long as back to 100% to where you were before the onset     I personally reviewed images and agree with radiology impression as follows:   Chest LDSCT     10/06/23 1. Lung-RADS 0, incomplete. Additional lung cancer screening CT images/or comparison to prior chest CT examinations  is needed. Development of innumerable pulmonary nodules and areas of nodular consolidation, favored to be infectious/inflammatory. Consider antibiotic therapy and lung cancer screening CT follow-up at 6-12 weeks. 2. Aortic atherosclerosis (ICD10-I70.0), coronary artery atherosclerosis and emphysema (ICD10-J43.9).  Rx doxy x 7 days 10/19/23   11/17/2023  f/u ov/[place] office/Aideen Fenster re: abn CT  maint on nothing  / still smoking/ does use otc gerd rx for flares of cough and seems to help  Chief Complaint  Patient presents with   COPD   Dyspnea:  still dancing does fine unless sinuses act up then starts cough last bad  episode clinically at Tgiving  Cough:  no am cough  Sleeping: flat bed one pillow s   resp cc  SABA use: not using it  02: none Lung cancer screening: 10/06/23  New night sweats x 3 weeks rec The key is to stop smoking completely before smoking completely stops you! My office will be contacting you by phone for referral to [facility]  > not done as of 01/25/2024  > declined  For cough / congestion > mucinex  dm or Mucinex  D (behind counter/have to sign) as needed  For wheeze > albuterol  2 puffs  evey 4 hours as needed  - 11/17/2023  alpha one AT phenotype >>>  MM  level 158  / IgE 15  Eos 0.2  - cxr ok   We will cancel the CT for March and see you in 6 weeks -bring inhaler with you    01/25/2024  f/u ov/[place] office/Shamel Germond re: GOLD 2 copd  maint on saba prn  did  bring inhaler but very poor hfa technique still smoking  Chief Complaint  Patient presents with   COPD  Dyspnea:  still dancing rarely / flat surface fast   Cough: none  Sleeping: flat bed one pillow  resp cc  SABA use: rarely use it  02: none  Lung cancer screening: due now   Rec The key is to stop smoking completely before smoking completely stops you! Only use your albuterol  as a rescue medication   Also  Ok to try albuterol  15 min before an activity (on alternating days)  that you know would usually  make you short of breath   Work on inhaler technique:    03/19/2024  f/u ov/[place] office/Kellie Chisolm re: GOLD 2 copd /cough since sept 2024  maint on alb prn  and omeprazole 40 mg but not ac Chief Complaint  Patient presents with   Follow-up    Results from ct - thinks her meds are making her sick and cough  Dyspnea:  still dancing but coughing limiting problem  Cough: mild smoker's rattle much worse in am on awakening > gobs of clear mucus Sleeping: flat bed one pillow and no noct   resp cc  SABA use: bid seems to help 02: none   Lung cancer screening: 02/16/24 RADS 2    No obvious day to day or daytime variability or assoc   hemoptysis or cp or chest tightness, subjective wheeze or overt sinus or hb symptoms.    Also denies any obvious fluctuation of symptoms with weather or environmental changes or other aggravating or alleviating factors except as outlined above   No unusual exposure hx or h/o childhood pna/ asthma or knowledge of premature birth.  Current Allergies, Complete Past Medical History, Past Surgical History, Family History, and Social History were reviewed in [employer] record.  ROS  The following are not active complaints unless bolded Hoarseness, sore throat/globus, dysphagia, dental problems, itching, sneezing,  nasal congestion or discharge of excess mucus or purulent secretions, ear ache,   fever, chills, sweats, unintended wt loss or wt gain, classically pleuritic or exertional cp,  orthopnea pnd or arm/hand swelling  or leg swelling, presyncope, palpitations, abdominal pain, anorexia, nausea, vomiting, diarrhea  or change in bowel habits or change in bladder habits, change in stools or change in urine, dysuria, hematuria,  rash, arthralgias, visual complaints, headache, numbness, weakness or ataxia or problems with walking or coordination,  change in mood or  memory.        Current Meds  Medication Sig   albuterol  (VENTOLIN  HFA) 108 (90 Base)  MCG/ACT inhaler Inhale 2 puffs into the lungs every 6 (six) hours as needed for wheezing or shortness of breath.   ALPRAZolam (XANAX) 1 MG tablet Take 1 mg by mouth daily as needed for anxiety.   ASPIRIN LOW DOSE 81 MG tablet Take 81 mg by mouth daily.   benzonatate  (TESSALON ) 100 MG capsule Take 100-200 mg by mouth 3 (three) times daily as needed.   omeprazole (PRILOSEC) 40 MG capsule Take 40 mg by mouth daily.   valsartan (DIOVAN)  80 MG tablet Take 80 mg by mouth daily.            Objective:   Physical Exam   Wts  03/19/2024        161  01/25/2024        159  11/17/2023        156  09/06/2023        161    01/13/17 195 lb 6.4 oz (88.6 kg)  12/26/16 190 lb 12.8 oz (86.5 kg)  12/13/16 191 lb (86.6 kg)    Vital signs reviewed  03/19/2024  - Note at rest 02 sats  93% on RA   General appearance:    amb wf nad    HEENT : Oropharynx  clear  Nasal turbinates nl    NECK :  without  apparent JVD/ palpable Nodes/TM    LUNGS: no acc muscle use,  Min barrel  contour chest wall with bilateral  slightly decreased bs s audible wheeze and  without cough on insp or exp maneuvers and min  Hyperresonant  to  percussion bilaterally    CV:  RRR  no s3 or murmur or increase in P2, and no edema   ABD:  soft and nontender with pos end  insp Hoover's  in the supine position.  No bruits or organomegaly appreciated   MS:  Nl gait/ ext warm without deformities Or obvious joint restrictions  calf tenderness, cyanosis or clubbing     SKIN: warm and dry without lesions    NEURO:  alert, approp, nl sensorium with  no motor or cerebellar deficits apparent.           Assessment:

## 2024-03-19 NOTE — Patient Instructions (Addendum)
 Omeprazole 40 mg Take 30- 60 min before your first and last meals of the day   GERD (REFLUX)  is an extremely common cause of respiratory symptoms just like yours , many times with no obvious heartburn at all.    It can be treated with medication, but also with lifestyle changes including elevation of the head of your bed (ideally with 6 -8inch blocks under the headboard of your bed),  Smoking cessation, avoidance of late meals, excessive alcohol, and avoid fatty foods, chocolate, peppermint, colas, red wine, and acidic juices such as orange juice.  NO MINT OR MENTHOL PRODUCTS SO NO COUGH DROPS - LUDENs ok  USE SUGARLESS CANDY INSTEAD (Jolley ranchers or Stover's or Life Savers) or even ice chips will also do - the key is to swallow to prevent all throat clearing. NO OIL BASED VITAMINS - use powdered substitutes.  Avoid fish oil when coughing.   For cough/ congestion > mucinex  dm  up to maximum of  1200 mg every 12 hours as needed   Symbicort 80 Take 2 puffs first thing in am and then another 2 puffs about 12 hours later.   Work on inhaler technique:  relax and gently blow all the way out then take a nice smooth full deep breath back in, triggering the inhaler at same time you start breathing in.  Hold breath in for at least  5 seconds if you can. Blow out symbicort  thru nose. Rinse and gargle with water when done.  If mouth or throat bother you at all,  try brushing teeth/gums/tongue with arm and hammer toothpaste/ make a slurry and gargle and spit out.   >>>  Remember how golfers warm up by taking practice swings - do this with an empty inhaler    Zpak  Prednisone  10 mg take  4 each am x 2 days,   2 each am x 2 days,  1 each am x 2 days and stop    Please schedule a follow up office visit in 6 weeks, call sooner if needed

## 2024-03-21 NOTE — Assessment & Plan Note (Signed)
 Onset Sept 2024 / active smoker  - 03/19/2024 rx pred/zpak and max gerd rx / mucinex  dm   Assoc with nasal congestion and globus as well  c/w Upper airway cough syndrome (previously labeled PNDS),  is so named because it's frequently impossible to sort out how much is  CR/sinusitis with freq throat clearing (which can be related to primary GERD)   vs  causing  secondary ( extra esophageal)  GERD from wide swings in gastric pressure that occur with throat clearing, often  promoting self use of mint and menthol lozenges that reduce the lower esophageal sphincter tone and exacerbate the problem further in a cyclical fashion.   These are the same pts (now being labeled as having irritable larynx syndrome by some cough centers) who not infrequently have a history of having failed to tolerate ace inhibitors,  dry powder inhalers (or even high dose hfa  ICS) or biphosphonates or report having atypical/extraesophageal reflux symptoms(LPR)  that don't respond to standard doses of PPI  and are easily confused as having aecopd or asthma flares by even experienced allergists/ pulmonologists (myself included).    F/u 6 weeks with all meds in hand using a trust but verify approach to confirm accurate Medication  Reconciliation The principal here is that until we are certain that the  patients are doing what we've asked, it makes no sense to ask them to do more.          Each maintenance medication was reviewed in detail including emphasizing most importantly the difference between maintenance and prns and under what circumstances the prns are to be triggered using an action plan format where appropriate.  Total time for H and P, chart review, counseling, reviewing hfa  device(s) and generating customized AVS unique to this office visit / same day charting = 36 min  for multiple chronic  refractory respiratory  symptoms of uncertain etiology

## 2024-03-21 NOTE — Assessment & Plan Note (Signed)
 Active smoker/MM - Spirometry 01/13/2017  FEV1 1.84 (68%)  Ratio 64 p no rx prior   - 11/17/2023  alpha one AT phenotype >>>  MM  level 158  / IgE 15  Eos 0.2  - 01/25/2024  After extensive coaching inhaler device,  effectiveness =    50% (consistently late trigger)  - 03/19/2024  After extensive coaching inhaler device,  effectiveness =    60% hfa (breztri sample only) > start symbicort 80 since her main c/o is cough   Should see reduction in bid dosing with saba  Re SABA :  I spent extra time with pt today reviewing appropriate use of albuterol  for prn use on exertion with the following points: 1) saba is for relief of sob that does not improve by walking a slower pace or resting but rather if the pt does not improve after trying this first. 2) If the pt is convinced, as many are, that saba helps recover from activity faster then it's easy to tell if this is the case by re-challenging : ie stop, take the inhaler, then p 5 minutes try the exact same activity (intensity of workload) that just caused the symptoms and see if they are substantially diminished or not after saba 3) if there is an activity that reproducibly causes the symptoms, try the saba 15 min before the activity on alternate days   If in fact the saba really does help, then fine to continue to use it prn but advised may need to look closer at the maintenance regimen  (for now symb 80) being used to achieve better control of airways disease with exertion.

## 2024-04-30 NOTE — Progress Notes (Deleted)
 Subjective:     Patient ID: Connie Shaw, female   DOB: [birth year]    MRN: [id number]  HPI  40 yowf  Active smoker referred to pulmonary clinic 01/13/2017 by  Ozell Gaskins PA from [facility] with LCS pos several nodules and ? Of COPD  With GOLD II criteria on eval 01/13/2017     01/13/2017 1st [facility] office visit/ Connie Shaw   Chief Complaint  Patient presents with   Pulmonary Consult    Referred by Ozell Gaskins, PA for eval of abnormal ct chest.   1st week in March 2018 fell and injured R Chest > CxR abn > CT with sev nodules and rec for 6 m per rad criteria  Doe = MMRC1 = can walk nl pace, flat grade, can't hurry or go uphills or steps s sob   Can't really tell proair  helps Rec You have only mild copd and unlikely to progress unless you continue to smoke  Pulmonary follow up is as needed     09/06/2023 Re-establish ov/[place] office/Connie Shaw re: GOLD 2 copd  maint on rx was on lisinopril   with severe cough resolved on ARB Chief Complaint  Patient presents with   Establish Care   COPD   Cough  Dyspnea:  dancing / shopping / housework  Cough: severe intermittent last episode Sept 2024  neg covid rx  abx / prednisone   09/04/23 woke up with ST / severe cough > thick clear > no sick contacts  Assoc nasal congestion and more sob than usual  Fever one day prior to OV  but no  fever on day of ov and st  Able to lie flat one pillow  Rec Try prilosec otc 20mg   Take 30-60 min before first meal of the day and Pepcid ac (famotidine) 20 mg one  after until cough and throat clearing are completely gone for at least a week without the need for cough suppression Zpak  Pulmonary follow up is as needed as long as back to 100% to where you were before the onset     I personally reviewed images and agree with radiology impression as follows:   Chest LDSCT     10/06/23 1. Lung-RADS 0, incomplete. Additional lung cancer screening CT images/or comparison to prior chest CT examinations  is needed. Development of innumerable pulmonary nodules and areas of nodular consolidation, favored to be infectious/inflammatory. Consider antibiotic therapy and lung cancer screening CT follow-up at 6-12 weeks. 2. Aortic atherosclerosis (ICD10-I70.0), coronary artery atherosclerosis and emphysema (ICD10-J43.9).  Rx doxy x 7 days 10/19/23   11/17/2023  f/u ov/[place] office/Connie Shaw re: abn CT  maint on nothing  / still smoking/ does use otc gerd rx for flares of cough and seems to help  Chief Complaint  Patient presents with   COPD   Dyspnea:  still dancing does fine unless sinuses act up then starts cough last bad  episode clinically at Tgiving  Cough:  no am cough  Sleeping: flat bed one pillow s   resp cc  SABA use: not using it  02: none Lung cancer screening: 10/06/23  New night sweats x 3 weeks rec The key is to stop smoking completely before smoking completely stops you! My office will be contacting you by phone for referral to [facility]  > not done as of 01/25/2024  > declined  For cough / congestion > mucinex  dm or Mucinex  D (behind counter/have to sign) as needed  For wheeze > albuterol  2 puffs  evey 4 hours as needed  - 11/17/2023  alpha one AT phenotype >>>  MM  level 158  / IgE 15  Eos 0.2  - cxr ok   We will cancel the CT for March and see you in 6 weeks -bring inhaler with you    01/25/2024  f/u ov/[place] office/Connie Shaw re: GOLD 2 copd  maint on saba prn  did  bring inhaler but very poor hfa technique still smoking  Chief Complaint  Patient presents with   COPD  Dyspnea:  still dancing rarely / flat surface fast   Cough: none  Sleeping: flat bed one pillow  resp cc  SABA use: rarely use it  02: none  Lung cancer screening: due now   Rec The key is to stop smoking completely before smoking completely stops you! Only use your albuterol  as a rescue medication   Also  Ok to try albuterol  15 min before an activity (on alternating days)  that you know would usually  make you short of breath   Work on inhaler technique:    03/19/2024  f/u ov/[place] office/Connie Shaw re: GOLD 2 copd /cough since sept 2024  maint on alb prn  and omeprazole  40 mg but not ac Chief Complaint  Patient presents with   Follow-up    Results from ct - thinks her meds are making her sick and cough  Dyspnea:  still dancing but coughing limiting problem  Cough: mild smoker's rattle much worse in am on awakening > gobs of clear mucus Sleeping: flat bed one pillow and no noct   resp cc  SABA use: bid seems to help 02: none  Lung cancer screening: 02/16/24 RADS 2  Rec    05/02/2024  f/u ov/[place] office/Connie Shaw re: GOLD 2 copd herold since sept 2024 maint on ***  No chief complaint on file.   Dyspnea:  *** Cough: *** Sleeping: ***   resp cc  SABA use: *** 02: ***  Lung cancer screening: ***   No obvious day to day or daytime variability or assoc excess/ purulent sputum or mucus plugs or hemoptysis or cp or chest tightness, subjective wheeze or overt sinus or hb symptoms.    Also denies any obvious fluctuation of symptoms with weather or environmental changes or other aggravating or alleviating factors except as outlined above   No unusual exposure hx or h/o childhood pna/ asthma or knowledge of premature birth.  Current Allergies, Complete Past Medical History, Past Surgical History, Family History, and Social History were reviewed in [employer] record.  ROS  The following are not active complaints unless bolded Hoarseness, sore throat, dysphagia, dental problems, itching, sneezing,  nasal congestion or discharge of excess mucus or purulent secretions, ear ache,   fever, chills, sweats, unintended wt loss or wt gain, classically pleuritic or exertional cp,  orthopnea pnd or arm/hand swelling  or leg swelling, presyncope, palpitations, abdominal pain, anorexia, nausea, vomiting, diarrhea  or change in bowel habits or change in bladder habits, change in  stools or change in urine, dysuria, hematuria,  rash, arthralgias, visual complaints, headache, numbness, weakness or ataxia or problems with walking or coordination,  change in mood or  memory.        No outpatient medications have been marked as taking for the 05/02/24 encounter (Appointment) with Darlean Ozell NOVAK, MD.             Objective:   Physical Exam   Wts  05/02/2024        ***  03/19/2024        161  01/25/2024        159  11/17/2023        156  09/06/2023        161    01/13/17 195 lb 6.4 oz (88.6 kg)  12/26/16 190 lb 12.8 oz (86.5 kg)  12/13/16 191 lb (86.6 kg)    Vital signs reviewed  05/02/2024  - Note at rest 02 sats  ***% on ***   General appearance:    ***   Min barr ***         Assessment:

## 2024-04-30 NOTE — Telephone Encounter (Signed)
 Connie Shaw

## 2024-05-02 ENCOUNTER — Ambulatory Visit: Admitting: Internal Medicine

## 2024-05-04 NOTE — Progress Notes (Deleted)
 Subjective:     Patient ID: Connie Shaw, female   DOB: 1956-07-08    MRN: [id number]  HPI  43 yowf  Active smoker referred to pulmonary clinic 01/13/2017 by  Connie Gaskins PA from [facility] with LCS pos several nodules and ? Of COPD  With GOLD II criteria on eval 01/13/2017     01/13/2017 1st  Pulmonary office visit/ Connie Shaw   Chief Complaint  Patient presents with   Pulmonary Consult    Referred by Connie Gaskins, PA for eval of abnormal ct chest.   1st week in March 2018 fell and injured R Chest > CxR abn > CT with sev nodules and rec for 6 m per rad criteria  Doe = MMRC1 = can walk nl pace, flat grade, can't hurry or go uphills or steps s sob   Can't really tell proair  helps Rec You have only mild copd and unlikely to progress unless you continue to smoke  Pulmonary follow up is as needed     09/06/2023 Re-establish ov/[place] office/Connie Shaw re: GOLD 2 copd  maint on rx was on lisinopril   with severe cough resolved on ARB Chief Complaint  Patient presents with   Establish Care   COPD   Cough  Dyspnea:  dancing / shopping / housework  Cough: severe intermittent last episode Sept 2024  neg covid rx  abx / prednisone   09/04/23 woke up with ST / severe cough > thick clear > no sick contacts  Assoc nasal congestion and more sob than usual  Fever one day prior to OV  but no  fever on day of ov and st  Able to lie flat one pillow  Rec Try prilosec otc 20mg   Take 30-60 min before first meal of the day and Pepcid ac (famotidine) 20 mg one  after until cough and throat clearing are completely gone for at least a week without the need for cough suppression Zpak  Pulmonary follow up is as needed as long as back to 100% to where you were before the onset     I personally reviewed images and agree with radiology impression as follows:   Chest LDSCT     10/06/23 1. Lung-RADS 0, incomplete. Additional lung cancer screening CT images/or comparison to prior chest CT examinations  is needed. Development of innumerable pulmonary nodules and areas of nodular consolidation, favored to be infectious/inflammatory. Consider antibiotic therapy and lung cancer screening CT follow-up at 6-12 weeks. 2. Aortic atherosclerosis (ICD10-I70.0), coronary artery atherosclerosis and emphysema (ICD10-J43.9).  Rx doxy x 7 days 10/19/23   11/17/2023  f/u ov/[place] office/Connie Shaw re: abn CT  maint on nothing  / still smoking/ does use otc gerd rx for flares of cough and seems to help  Chief Complaint  Patient presents with   COPD   Dyspnea:  still dancing does fine unless sinuses act up then starts cough last bad  episode clinically at Tgiving  Cough:  no am cough  Sleeping: flat bed one pillow s   resp cc  SABA use: not using it  02: none Lung cancer screening: 10/06/23  New night sweats x 3 weeks rec The key is to stop smoking completely before smoking completely stops you! My office will be contacting you by phone for referral to [facility]  > not done as of 01/25/2024  > declined  For cough / congestion > mucinex  dm or Mucinex  D (behind counter/have to sign) as needed  For wheeze > albuterol  2 puffs  evey 4 hours as needed  - 11/17/2023  alpha one AT phenotype >>>  MM  level 158  / IgE 15  Eos 0.2  - cxr ok   We will cancel the CT for March and see you in 6 weeks -bring inhaler with you    01/25/2024  f/u ov/[place] office/Connie Shaw re: GOLD 2 copd  maint on saba prn  did  bring inhaler but very poor hfa technique still smoking  Chief Complaint  Patient presents with   COPD  Dyspnea:  still dancing rarely / flat surface fast   Cough: none  Sleeping: flat bed one pillow  resp cc  SABA use: rarely use it  02: none  Lung cancer screening: due now   Rec The key is to stop smoking completely before smoking completely stops you! Only use your albuterol  as a rescue medication   Also  Ok to try albuterol  15 min before an activity (on alternating days)  that you know would usually  make you short of breath   Work on inhaler technique:    03/19/2024  f/u ov/[place] office/Connie Shaw re: GOLD 2 copd /cough since sept 2024  maint on alb prn  and omeprazole  40 mg but not ac Chief Complaint  Patient presents with   Follow-up    Results from ct - thinks her meds are making her sick and cough  Dyspnea:  still dancing but coughing limiting problem  Cough: mild smoker's rattle much worse in am on awakening > gobs of clear mucus Sleeping: flat bed one pillow and no noct   resp cc  SABA use: bid seems to help 02: none  Lung cancer screening: 02/16/24 RADS 2  Rec Omeprazole  40 mg Take 30- 60 min before your first and last meals of the day  GERD diet reviewed, bed blocks rec  For cough/ congestion > mucinex  dm  up to maximum of  1200 mg every 12 hours as needed  Symbicort  80 Take 2 puffs first thing in am and then another 2 puffs about 12 hours later.  Work on inhaler technique:  >>>  Remember how golfers warm up by taking practice swings - do this with an empty inhaler  Zpak Prednisone  10 mg take  4 each am x 2 days,   2 each am x 2 days,  1 each am x 2 days and stop    Please schedule a follow up office visit in 6 weeks, call sooner if needed    05/06/2024  f/u ov/[place] office/Connie Shaw re: GOLD 2 copd /cough since sept 2024 maint on ***  No chief complaint on file.   Dyspnea:  *** Cough: *** Sleeping: ***   resp cc  SABA use: *** 02: ***  Lung cancer screening: ***   No obvious day to day or daytime variability or assoc excess/ purulent sputum or mucus plugs or hemoptysis or cp or chest tightness, subjective wheeze or overt sinus or hb symptoms.    Also denies any obvious fluctuation of symptoms with weather or environmental changes or other aggravating or alleviating factors except as outlined above   No unusual exposure hx or h/o childhood pna/ asthma or knowledge of premature birth.  Current Allergies, Complete Past Medical History, Past Surgical History,  Family History, and Social History were reviewed in [employer] record.  ROS  The following are not active complaints unless bolded Hoarseness, sore throat, dysphagia, dental problems, itching, sneezing,  nasal congestion or discharge of excess mucus or  purulent secretions, ear ache,   fever, chills, sweats, unintended wt loss or wt gain, classically pleuritic or exertional cp,  orthopnea pnd or arm/hand swelling  or leg swelling, presyncope, palpitations, abdominal pain, anorexia, nausea, vomiting, diarrhea  or change in bowel habits or change in bladder habits, change in stools or change in urine, dysuria, hematuria,  rash, arthralgias, visual complaints, headache, numbness, weakness or ataxia or problems with walking or coordination,  change in mood or  memory.        No outpatient medications have been marked as taking for the 05/06/24 encounter (Appointment) with Darlean Connie NOVAK, MD.             Objective:   Physical Exam   Wts  05/06/2024        ***  03/19/2024        161  01/25/2024        159  11/17/2023        156  09/06/2023        161    01/13/17 195 lb 6.4 oz (88.6 kg)  12/26/16 190 lb 12.8 oz (86.5 kg)  12/13/16 191 lb (86.6 kg)    Vital signs reviewed  05/06/2024  - Note at rest 02 sats  ***% on ***   General appearance:    ***   Min barr ***         Assessment:

## 2024-05-06 ENCOUNTER — Ambulatory Visit: Admitting: Internal Medicine

## 2024-05-06 DIAGNOSIS — J449 Chronic obstructive pulmonary disease, unspecified: Secondary | ICD-10-CM

## 2024-06-12 NOTE — Progress Notes (Signed)
  Cardiology Office Note:   Date:  06/12/2024  ID:  Connie Shaw, DOB 12/29/1955, MRN [id number] PCP: Caleen Dirks, MD  [facility] Providers Cardiologist:  None { Chief Complaint: No chief complaint on file.     History of Present Illness:   Connie Shaw is a 68 y.o. female with a PMH of coronary calcifications, HTN, CKD, tobacco use, COPD who presents as a new patient evaluation for coronary calcification.  The patient underwent CT chest for lung cancer screening on 03/11/2024 which revealed coronary and aortic calcifications.  She is referred for evaluation of these.   Past Medical History:  Diagnosis Date   Depression     Fam Hx:  Studies Reviewed:    EKG: ***           Risk Assessment/Calculations:   {Does this patient have ATRIAL FIBRILLATION?:[phone]} No BP recorded.  {Refresh Note OR Click here to enter BP  :1}***        Physical Exam:     VS:  There were no vitals taken for this visit. ***    Wt Readings from Last 3 Encounters:  03/19/24 161 lb 3.2 oz (73.1 kg)  02/20/24 154 lb 8 oz (70.1 kg)  01/25/24 159 lb 8 oz (72.3 kg)     GEN: Well nourished, well developed, in no acute distress NECK: No JVD; No carotid bruits CARDIAC: ***RRR, no murmurs, rubs, gallops RESPIRATORY:  Clear to auscultation without rales, wheezing or rhonchi  ABDOMEN: Soft, non-tender, non-distended, normal bowel sounds EXTREMITIES:  Warm and well perfused, no edema; No deformity, 2+ radial pulses PSYCH: Normal mood and affect   Assessment & Plan       {Are you ordering a CV Procedure (e.g. stress test, cath, DCCV, TEE, etc)?   Press F2        :[id number]}   This note was written with the assistance of a dictation microphone or AI dictation software. Please excuse any typos or grammatical errors.   Signed, Georganna Archer, MD 06/12/2024 3:26 PM    [facility]

## 2024-06-13 ENCOUNTER — Other Ambulatory Visit (HOSPITAL_COMMUNITY): Payer: Self-pay

## 2024-06-13 ENCOUNTER — Ambulatory Visit
Attending: Student in an Organized Health Care Education/Training Program | Admitting: Student in an Organized Health Care Education/Training Program

## 2024-06-13 VITALS — BP 156/80 | HR 59 | Ht 66.0 in | Wt 159.0 lb

## 2024-06-13 DIAGNOSIS — I2510 Atherosclerotic heart disease of native coronary artery without angina pectoris: Secondary | ICD-10-CM | POA: Insufficient documentation

## 2024-06-13 DIAGNOSIS — Z720 Tobacco use: Secondary | ICD-10-CM

## 2024-06-13 DIAGNOSIS — I10 Essential (primary) hypertension: Secondary | ICD-10-CM | POA: Diagnosis not present

## 2024-06-13 DIAGNOSIS — E782 Mixed hyperlipidemia: Secondary | ICD-10-CM | POA: Diagnosis not present

## 2024-06-13 DIAGNOSIS — I25118 Atherosclerotic heart disease of native coronary artery with other forms of angina pectoris: Secondary | ICD-10-CM | POA: Diagnosis not present

## 2024-06-13 DIAGNOSIS — I2089 Other forms of angina pectoris: Secondary | ICD-10-CM

## 2024-06-13 MED ORDER — VALSARTAN 160 MG PO TABS
160.0000 mg | ORAL_TABLET | Freq: Every day | ORAL | 3 refills | Status: AC
  Filled 2024-06-13: qty 90, 90d supply, fill #0

## 2024-06-13 MED ORDER — BLOOD PRESSURE MONITOR MISC
0 refills | Status: AC
  Filled 2024-06-13: qty 1, 30d supply, fill #0

## 2024-06-13 NOTE — Patient Instructions (Addendum)
 Medication Instructions:  INCREASE Valsartan  to 160 mg daily  START use of blood pressure cuff (CHECK BLOOD PRESSURE TWICE DAILY FOR TWO WEEKS AND ADVISE READINGS)  Blood Pressure Record Sheet To take your blood pressure, you will need a blood pressure machine. You can buy a blood pressure machine (blood pressure monitor) at your clinic, drug store, or online. When choosing one, consider: An automatic monitor that has an arm cuff. A cuff that wraps snugly around your upper arm. You should be able to fit only one finger between your arm and the cuff. A device that stores blood pressure reading results. Do not choose a monitor that measures your blood pressure from your wrist or finger. Follow your health care provider's instructions for how to take your blood pressure. To use this form: Take your blood pressure medications every day These measurements should be taken when you have been at rest for at least 10-15 min Take at least 2 readings with each blood pressure check. This makes sure the results are correct. Wait 1-2 minutes between measurements. Write down the results in the spaces on this form. Keep in mind it should always be recorded systolic over diastolic. Both numbers are important.  Repeat this every day for 2-3 weeks, or as told by your health care provider.  Make a follow-up appointment with your health care provider to discuss the results.  Blood Pressure Log Date Medications taken? (Y/N) Blood Pressure Time of Day                                                                                                        *If you need a refill on your cardiac medications before your next appointment, please call your pharmacy*  Lab Work: LIPID PANEL  LPA BMP  If you have labs (blood work) drawn today and your tests are completely normal, you will receive your results only by: MyChart Message (if you have MyChart) OR A paper copy in the mail If you have any  lab test that is abnormal or we need to change your treatment, we will call you to review the results.  Testing/Procedures: CORONARY CTA   Cardiac CT Angiography (CTA), is a special type of CT scan that uses a computer to produce multi-dimensional views of major blood vessels throughout the body. In CT angiography, a contrast material is injected through an IV to help visualize the blood vessels   Follow-Up: At [facility], you and your health needs are our priority.  As part of our continuing mission to provide you with exceptional heart care, our providers are all part of one team.  This team includes your primary Cardiologist (physician) and Advanced Practice Providers or APPs (Physician Assistants and Nurse Practitioners) who all work together to provide you with the care you need, when you need it.  Your next appointment:   1 month(s)  Provider:   One of our Advanced Practice Providers (APPs): Morse Clause, PA-C  Lamarr Satterfield, NP Miriam Shams, NP  Olivia Pavy, PA-C Josefa Beauvais, NP  Leontine Salen, PA-C Orren  Shelby, PA-C  [place], [place] Jackee Alberts, NP  Damien Braver, NP Jon Hails, PA-C  Waddell Donath, PA-C    Dayna Dunn, PA-C  Scott Weaver, PA-C Lum Louis, NP Katlyn West, NP Callie Goodrich, PA-C  Evan Williams, PA-C Sheng Haley, PA-C  Xika Zhao, NP Kathleen Johnson, PA-C   Then, Georganna Archer, MD will plan to see you again in 6 month(s).    We recommend signing up for the patient portal called MyChart.  Sign up information is provided on this After Visit Summary.  MyChart is used to connect with patients for Virtual Visits (Telemedicine).  Patients are able to view lab/test results, encounter notes, upcoming appointments, etc.  Non-urgent messages can be sent to your provider as well.   To learn more about what you can do with MyChart, go to [email or web address].   Other Instructions   Your cardiac CT will be scheduled at one of the below  locations:  [facility] [address]  [place], [place] [address] [phone]   If scheduled at the Heart and Vascular Tower at [facility] street, please enter the parking lot using the [address] entrance and use the FREE valet service at the patient drop-off area. Enter the building and check-in with registration on the main floor.  Please follow these instructions carefully (unless otherwise directed):  An IV will be required for this test and Nitroglycerin  will be given.  Hold all erectile dysfunction medications at least 3 days (72 hrs) prior to test. (Ie viagra, cialis, sildenafil, tadalafil, etc)   On the Night Before the Test: Be sure to Drink plenty of water. Do not consume any caffeinated/decaffeinated beverages or chocolate 12 hours prior to your test. Do not take any antihistamines 12 hours prior to your test.  On the Day of the Test: Drink plenty of water until 1 hour prior to the test. Do not eat any food 1 hour prior to test. You may take your regular medications prior to the test.  If you take Furosemide/Hydrochlorothiazide/Spironolactone/Chlorthalidone, please HOLD on the morning of the test. Patients who wear a continuous glucose monitor MUST remove the device prior to scanning. FEMALES- please wear underwire-free bra if available, avoid dresses & tight clothing      After the Test: Drink plenty of water. After receiving IV contrast, you may experience a mild flushed feeling. This is normal. On occasion, you may experience a mild rash up to 24 hours after the test. This is not dangerous. If this occurs, you can take Benadryl 25 mg, Zyrtec , Claritin, or Allegra  and increase your fluid intake. (Patients taking Tikosyn should avoid Benadryl, and may take Zyrtec , Claritin, or Allegra ) If you experience trouble breathing, this can be serious. If it is severe call 911 IMMEDIATELY. If it is mild, please call our office.  We will call to  schedule your test 2-4 weeks out understanding that some insurance companies will need an authorization prior to the service being performed.   For more information and frequently asked questions, please visit our website : [email or web address]  For non-scheduling related questions, please contact the cardiac imaging nurse navigator should you have any questions/concerns: Cardiac Imaging Nurse Navigators Direct Office Dial: [phone]   For scheduling needs, including cancellations and rescheduling, please call [place], [phone].

## 2024-06-13 NOTE — Assessment & Plan Note (Signed)
 Lipid panel and lipoprotein a checked today

## 2024-06-13 NOTE — Assessment & Plan Note (Signed)
 Her blood pressure is elevated today and she states that on recent checks it has been elevated last few readings.  I will increase her valsartan  to 160 mg daily to improve blood pressure control.  I will also prescribe her blood pressure cuff to monitor her blood pressures at home. -Increase valsartan  to 160 mg daily -Blood pressure cuff for home monitoring -1 month follow-up with APP for blood pressure

## 2024-06-13 NOTE — Assessment & Plan Note (Signed)
 I spoke to the patient extensively about her tobacco use.  She is precontemplative regarding her desire to quit smoking.  She has tried several therapies for smoking cessation without success.  I offered for her to retry Wellbutrin or nicotine  patches but she declined.  We will continue to work on smoking cessation over time.

## 2024-06-13 NOTE — Assessment & Plan Note (Signed)
 Patient found to have coronary calcifications on lung cancer CT screening test.  She is reporting episodes of chest pain which are not classic for angina; however, she has multiple significant risk factors for CAD including active tobacco use, HTN, and a positive family history.  I think this warrants a coronary CT angiogram to rule out obstructive CAD.  Equally as important, I will further stratify her by checking a lipid profile to determine how aggressive you need to be with lowering her LDL which ideally should be at least <70. - Coronary CT angiogram for chest pain.  Of note the patient is bradycardic in clinic so I would not prescribe a beta-blocker for her CCTA at this time.  But if her heart rate is more elevated prior to the CCTA, please administer metoprolol per protocol. - Lipid panel, LPA - Continue baby aspirin daily - Continue statin as currently prescribed until assessment of her LDL -BMP - 75-month follow-up with me

## 2024-06-15 LAB — LIPID PANEL
Chol/HDL Ratio: 3.2 ratio (ref 0.0–4.4)
Cholesterol, Total: 208 mg/dL — ABNORMAL HIGH (ref 100–199)
HDL: 66 mg/dL (ref >39)
LDL Chol Calc (NIH): 126 mg/dL — ABNORMAL HIGH (ref 0–99)
Triglycerides: 91 mg/dL (ref 0–149)
VLDL Cholesterol Cal: 16 mg/dL (ref 5–40)

## 2024-06-15 LAB — BASIC METABOLIC PANEL WITH GFR
BUN/Creatinine Ratio: 13 (ref 12–28)
BUN: 12 mg/dL (ref 8–27)
CO2: 25 mmol/L (ref 20–29)
Calcium: 9.3 mg/dL (ref 8.7–10.3)
Chloride: 102 mmol/L (ref 96–106)
Creatinine, Ser: 0.91 mg/dL (ref 0.57–1.00)
Glucose: 76 mg/dL (ref 70–99)
Potassium: 4.1 mmol/L (ref 3.5–5.2)
Sodium: 141 mmol/L (ref 134–144)
eGFR: 68 mL/min/1.73 (ref >59)

## 2024-06-15 LAB — LIPOPROTEIN A (LPA): Lipoprotein (a): 146.1 nmol/L — AB (ref <75.0)

## 2024-06-17 ENCOUNTER — Ambulatory Visit (HOSPITAL_COMMUNITY)
Admission: RE | Admit: 2024-06-17 | Discharge: 2024-06-17 | Disposition: A | Discharge status: Home / Self Care | Source: Ambulatory Visit | Attending: Student in an Organized Health Care Education/Training Program

## 2024-06-17 ENCOUNTER — Ambulatory Visit: Payer: Self-pay | Admitting: Student in an Organized Health Care Education/Training Program

## 2024-06-17 DIAGNOSIS — J984 Other disorders of lung: Secondary | ICD-10-CM | POA: Diagnosis not present

## 2024-06-17 DIAGNOSIS — I25118 Atherosclerotic heart disease of native coronary artery with other forms of angina pectoris: Secondary | ICD-10-CM | POA: Diagnosis not present

## 2024-06-17 DIAGNOSIS — I2089 Other forms of angina pectoris: Secondary | ICD-10-CM

## 2024-06-17 DIAGNOSIS — I2510 Atherosclerotic heart disease of native coronary artery without angina pectoris: Secondary | ICD-10-CM | POA: Diagnosis not present

## 2024-06-17 DIAGNOSIS — E782 Mixed hyperlipidemia: Secondary | ICD-10-CM

## 2024-06-17 MED ORDER — IOHEXOL 350 MG/ML SOLN
100.0000 mL | Freq: Once | INTRAVENOUS | Status: AC | PRN
  Administered 2024-06-17: 100 mL via INTRAVENOUS

## 2024-06-17 MED ORDER — NITROGLYCERIN 0.4 MG SL SUBL
0.8000 mg | SUBLINGUAL_TABLET | Freq: Once | SUBLINGUAL | Status: AC
  Administered 2024-06-17: 0.8 mg via SUBLINGUAL

## 2024-06-17 MED ORDER — ROSUVASTATIN CALCIUM 40 MG PO TABS: 40.0000 mg | ORAL_TABLET | Freq: Every day | ORAL | 3 refills | Status: AC

## 2024-06-18 ENCOUNTER — Other Ambulatory Visit: Payer: Self-pay | Admitting: Cardiology

## 2024-06-18 ENCOUNTER — Ambulatory Visit (HOSPITAL_BASED_OUTPATIENT_CLINIC_OR_DEPARTMENT_OTHER)
Admission: RE | Admit: 2024-06-18 | Discharge: 2024-06-18 | Disposition: A | Discharge status: Home / Self Care | Source: Ambulatory Visit | Attending: Cardiology | Admitting: Cardiology

## 2024-06-18 DIAGNOSIS — I2510 Atherosclerotic heart disease of native coronary artery without angina pectoris: Secondary | ICD-10-CM | POA: Diagnosis not present

## 2024-06-18 DIAGNOSIS — R931 Abnormal findings on diagnostic imaging of heart and coronary circulation: Secondary | ICD-10-CM

## 2024-06-19 ENCOUNTER — Ambulatory Visit: Payer: Self-pay | Admitting: Cardiology

## 2024-06-21 ENCOUNTER — Telehealth: Payer: Self-pay | Admitting: Student in an Organized Health Care Education/Training Program

## 2024-06-21 NOTE — Telephone Encounter (Signed)
 Pt is requesting callback regarding her CT results. Please advise.

## 2024-06-21 NOTE — Telephone Encounter (Signed)
 The patient has been notified of the result and verbalized understanding.  All questions (if any) were answered. Lyrick Lagrand Chauvigne, RN 06/21/2024 12:19 PM     Floretta Mallard, MD  Hi Mrs. Court!   Your coronary CT scan showed some areas of blockage that are NOT flow limiting. This means that your chest pain is NOT coming from your heart! This is great news. Since you do have calcium /blockages in the arteries however, we do need to be aggressive in lowering your LDL so that they don't get worse. I'll keep an eye out for that next lipid panel!   Best Regards,   Dr. Floretta

## 2024-07-01 NOTE — Progress Notes (Deleted)
 Subjective:     Patient ID: Connie Shaw, female   DOB: [birth year]    MRN: [id number]  HPI  52 yowf  Active smoker referred to pulmonary clinic 01/13/2017 by  Ozell Gaskins PA from [facility] with LCS pos several nodules and ? Of COPD  With GOLD II criteria on eval 01/13/2017     01/13/2017 1st [facility] office visit/ Latrelle Bazar   Chief Complaint  Patient presents with   Pulmonary Consult    Referred by Ozell Gaskins, PA for eval of abnormal ct chest.   1st week in March 2018 fell and injured R Chest > CxR abn > CT with sev nodules and rec for 6 m per rad criteria  Doe = MMRC1 = can walk nl pace, flat grade, can't hurry or go uphills or steps s sob   Can't really tell proair  helps Rec You have only mild copd and unlikely to progress unless you continue to smoke  Pulmonary follow up is as needed     09/06/2023 Re-establish ov/[place] office/Taiana Temkin re: GOLD 2 copd  maint on rx was on lisinopril   with severe cough resolved on ARB Chief Complaint  Patient presents with   Establish Care   COPD   Cough  Dyspnea:  dancing / shopping / housework  Cough: severe intermittent last episode Sept 2024  neg covid rx  abx / prednisone   09/04/23 woke up with ST / severe cough > thick clear > no sick contacts  Assoc nasal congestion and more sob than usual  Fever one day prior to OV  but no  fever on day of ov and st  Able to lie flat one pillow  Rec Try prilosec otc 20mg   Take 30-60 min before first meal of the day and Pepcid ac (famotidine) 20 mg one  after until cough and throat clearing are completely gone for at least a week without the need for cough suppression Zpak  Pulmonary follow up is as needed as long as back to 100% to where you were before the onset     I personally reviewed images and agree with radiology impression as follows:   Chest LDSCT     10/06/23 1. Lung-RADS 0, incomplete. Additional lung cancer screening CT images/or comparison to prior chest CT examinations  is needed. Development of innumerable pulmonary nodules and areas of nodular consolidation, favored to be infectious/inflammatory. Consider antibiotic therapy and lung cancer screening CT follow-up at 6-12 weeks. 2. Aortic atherosclerosis (ICD10-I70.0), coronary artery atherosclerosis and emphysema (ICD10-J43.9).  Rx doxy x 7 days 10/19/23   11/17/2023  f/u ov/[place] office/Tyon Cerasoli re: abn CT  maint on nothing  / still smoking/ does use otc gerd rx for flares of cough and seems to help  Chief Complaint  Patient presents with   COPD   Dyspnea:  still dancing does fine unless sinuses act up then starts cough last bad  episode clinically at Tgiving  Cough:  no am cough  Sleeping: flat bed one pillow s   resp cc  SABA use: not using it  02: none Lung cancer screening: 10/06/23  New night sweats x 3 weeks rec The key is to stop smoking completely before smoking completely stops you! My office will be contacting you by phone for referral to [facility]  > not done as of 01/25/2024  > declined  For cough / congestion > mucinex  dm or Mucinex  D (behind counter/have to sign) as needed  For wheeze > albuterol  2 puffs  evey 4 hours as needed  - 11/17/2023  alpha one AT phenotype >>>  MM  level 158  / IgE 15  Eos 0.2  - cxr ok   We will cancel the CT for March and see you in 6 weeks -bring inhaler with you    01/25/2024  f/u ov/[place] office/Ivis Henneman re: GOLD 2 copd  maint on saba prn  did  bring inhaler but very poor hfa technique still smoking  Chief Complaint  Patient presents with   COPD  Dyspnea:  still dancing rarely / flat surface fast   Cough: none  Sleeping: flat bed one pillow  resp cc  SABA use: rarely use it  02: none  Lung cancer screening: due now   Rec The key is to stop smoking completely before smoking completely stops you! Only use your albuterol  as a rescue medication   Also  Ok to try albuterol  15 min before an activity (on alternating days)  that you know would usually  make you short of breath   Work on inhaler technique:    03/19/2024  f/u ov/[place] office/Vici Novick re: GOLD 2 copd /cough since sept 2024  maint on alb prn  and omeprazole  40 mg but not ac Chief Complaint  Patient presents with   Follow-up    Results from ct - thinks her meds are making her sick and cough  Dyspnea:  still dancing but coughing limiting problem  Cough: mild smoker's rattle much worse in am on awakening > gobs of clear mucus Sleeping: flat bed one pillow and no noct   resp cc  SABA use: bid seems to help 02: none  Lung cancer screening: 02/16/24 RADS 2  Rec Omeprazole  40 mg Take 30- 60 min before your first and last meals of the day   GERD (REFLUX)  is an extremely common cause of respiratory symptoms just like yours , many times with no obvious heartburn at all.    It can be treated with medication, but also with lifestyle changes including elevation of the head of your bed (ideally with 6 -8inch blocks under the headboard of your bed),  Smoking cessation, avoidance of late meals, excessive alcohol, and avoid fatty foods, chocolate, peppermint, colas, red wine, and acidic juices such as orange juice.  NO MINT OR MENTHOL PRODUCTS SO NO COUGH DROPS - LUDENs ok  USE SUGARLESS CANDY INSTEAD (Jolley ranchers or Stover's or Life Savers) or even ice chips will also do - the key is to swallow to prevent all throat clearing. NO OIL BASED VITAMINS - use powdered substitutes.  Avoid fish oil when coughing.   For cough/ congestion > mucinex  dm  up to maximum of  1200 mg every 12 hours as needed   Symbicort  80 Take 2 puffs first thing in am and then another 2 puffs about 12 hours later.   Work on inhaler technique:  relax and gently blow all the way out then take a nice smooth full deep breath back in, triggering the inhaler at same time you start breathing in.  Hold breath in for at least  5 seconds if you can. Blow out symbicort   thru nose. Rinse and gargle with water when done.  If  mouth or throat bother you at all,  try brushing teeth/gums/tongue with arm and hammer toothpaste/ make a slurry and gargle and spit out.   >>>  Remember how golfers warm up by taking practice swings - do this with an empty inhaler  Zpak  Prednisone  10 mg take  4 each am x 2 days,   2 each am x 2 days,  1 each am x 2 days and stop    Please schedule a follow up office visit in 6 weeks, call sooner if needed     07/02/2024  f/u ov/Adalena Abdulla re: ***   maint on ***  No chief complaint on file.   Dyspnea:  *** Cough: *** Sleeping: *** resp cc  SABA use: *** 02: ***  Lung cancer screening :  ***    No obvious day to day or daytime variability or assoc excess/ purulent sputum or mucus plugs or hemoptysis or cp or chest tightness, subjective wheeze or overt sinus or hb symptoms.    Also denies any obvious fluctuation of symptoms with weather or environmental changes or other aggravating or alleviating factors except as outlined above   No unusual exposure hx or h/o childhood pna/ asthma or knowledge of premature birth.  Current Allergies, Complete Past Medical History, Past Surgical History, Family History, and Social History were reviewed in [employer] record.  ROS  The following are not active complaints unless bolded Hoarseness, sore throat, dysphagia, dental problems, itching, sneezing,  nasal congestion or discharge of excess mucus or purulent secretions, ear ache,   fever, chills, sweats, unintended wt loss or wt gain, classically pleuritic or exertional cp,  orthopnea pnd or arm/hand swelling  or leg swelling, presyncope, palpitations, abdominal pain, anorexia, nausea, vomiting, diarrhea  or change in bowel habits or change in bladder habits, change in stools or change in urine, dysuria, hematuria,  rash, arthralgias, visual complaints, headache, numbness, weakness or ataxia or problems with walking or coordination,  change in mood or  memory.        No  outpatient medications have been marked as taking for the 07/02/24 encounter (Appointment) with Darlean Ozell NOVAK, MD.              Objective:   Physical Exam   Wts  07/02/2024         ***  03/19/2024        161  01/25/2024        159  11/17/2023        156  09/06/2023        161    01/13/17 195 lb 6.4 oz (88.6 kg)  12/26/16 190 lb 12.8 oz (86.5 kg)  12/13/16 191 lb (86.6 kg)    Vital signs reviewed  07/02/2024  - Note at rest 02 sats  ***% on ***   General appearance:    ***   Min barr***         Assessment:

## 2024-07-02 ENCOUNTER — Ambulatory Visit: Admitting: Internal Medicine

## 2024-07-15 NOTE — Progress Notes (Unsigned)
 Cardiology Clinic Note   Patient Name: Connie Shaw Date of Encounter: 07/17/2024  Primary Care Provider:  Pcp, No Primary Cardiologist:  Georganna Archer, MD  Patient Profile    Connie Shaw 68 year old female presents to the clinic today for follow-up evaluation of her coronary artery disease.  Past Medical History    Past Medical History:  Diagnosis Date   Atherosclerosis of native coronary artery of native heart without angina pectoris    Depression    Family history of heart disease    Past Surgical History:  Procedure Laterality Date   CHOLECYSTECTOMY     MOUTH SURGERY      Allergies  Allergies  Allergen Reactions   Codeine Itching   Pravastatin      Pt stated, Makes me sick as a dog; I feel like I have the flu    History of Present Illness    Connie Shaw has a PMH of CAD, HLD, HTN, tobacco use, and stable angina.  Her PMH also includes COPD.  She was previously seen by Dr. Archer to establish care for coronary calcifications.  She underwent chest CT for lung cancer screening 03/11/2024 which showed coronary and aortic calcifications.  She was referred for evaluation due to findings.  She reported that she had been noting occasional episodes of chest tightness that occurred primarily at night and at rest.  She did note some shortness of breath which she attributed to smoking.  She denied orthopnea, PND, palpitations, lower extremity swelling, syncope, and presyncope.  She reported good adherence with her medications.  She reported having anxiety/depression and was unsure if her chest tightness was related to her heart or her anxiety.  She noted a longstanding history of tobacco use.  She was smoking at least half pack per day for 30 years.  She denied alcohol and illicit drug use.  She noted that she had tried several medications and interventions to quit smoking such as nicotine  patch, Nicorette gum, and Wellbutrin without success.  Lipid  panel, LP(a), BMP, and coronary CTA were ordered.  She was noted to have LDL of 126 and LP(a) of 146.1.  Her rosuvastatin  was increased to 40 mg daily with plan to repeat fasting lipids and LFTs in 3 months.  Her coronary CTA showed a coronary calcium  score of 442 and this placed her in the 91st percentile for age sex and race matched controls.  She was noted to have moderate proximal-mid LAD stenosis of 50-69%, moderate distal stenosis in her LAD, mild proximal-mid RCA stenosis 30-49%, and mild mid-distal circumflex stenosis 30-49%.  FFR was normal, no flow limitations were noted.  She presents to the clinic today for follow-up evaluation and states since having valsartan  added to her medication regimen and rosuvastatin  her blood pressure has been much better.  We reviewed her previous visit and she expressed understanding.  We reviewed her cholesterol and coronary CTA test.  She denies chest discomfort.  I will plan for repeat fasting lipids and LFTs in December, give her smoking cessation information, and plan follow-up in 6 months.  Today in clinic her blood pressure is 116/64..  Today she denies chest pain, shortness of breath, lower extremity edema, fatigue, palpitations, melena, hematuria, hemoptysis, diaphoresis, weakness, presyncope, syncope, orthopnea, and PND.     Home Medications    Prior to Admission medications   Medication Sig Start Date End Date Taking? Authorizing Provider  albuterol  (VENTOLIN  HFA) 108 (90 Base) MCG/ACT inhaler Inhale 2 puffs into the  lungs every 6 (six) hours as needed for wheezing or shortness of breath. 11/17/23   Cobb, Katherine V, NP  ALPRAZolam (XANAX) 1 MG tablet Take 1 mg by mouth daily as needed for anxiety.    [provider]  ASPIRIN LOW DOSE 81 MG tablet Take 81 mg by mouth daily. 11/16/23   [provider]  azithromycin  (ZITHROMAX ) 250 MG tablet Take 2 on day one then 1 daily x 4 days Patient not taking: Reported on 06/13/2024 03/19/24    Darlean Ozell NOVAK, MD  benzonatate  (TESSALON ) 100 MG capsule Take 100-200 mg by mouth 3 (three) times daily as needed. Patient not taking: Reported on 06/13/2024 02/27/24   [provider]  Blood Pressure Monitor MISC Check blood pressure twice daily 06/13/24   Floretta Mallard, MD  budesonide -formoterol  (SYMBICORT ) 80-4.5 MCG/ACT inhaler Take 2 puffs first thing in am and then another 2 puffs about 12 hours later. Patient not taking: Reported on 06/13/2024 03/19/24   Wert, Michael B, MD  Estradiol 10 MCG TABS vaginal tablet Place 1 tablet vaginally 2 (two) times a week. Patient not taking: Reported on 06/13/2024 06/30/23   [provider]  fexofenadine  (ALLEGRA ) 180 MG tablet Take 1 tablet (180 mg total) by mouth daily. Patient not taking: Reported on 06/13/2024 02/20/24   Amin, Saad, MD  ibuprofen  (ADVIL ,MOTRIN ) 600 MG tablet Take 1 tablet (600 mg total) by mouth every 8 (eight) hours as needed. 09/02/15   Gretta Ozell CROME, PA-C  omeprazole  (PRILOSEC) 40 MG capsule Take 30- 60 min before your first and last meals of the day 03/19/24   Darlean Ozell NOVAK, MD  oxymetazoline  (AFRIN) 0.05 % nasal spray Place 1 spray into both nostrils 2 (two) times daily. Patient not taking: Reported on 06/13/2024 02/20/24   Amin, Saad, MD  predniSONE  (DELTASONE ) 10 MG tablet Take  4 each am x 2 days,   2 each am x 2 days,  1 each am x 2 days and stop Patient not taking: Reported on 06/13/2024 03/19/24   Darlean Ozell NOVAK, MD  rosuvastatin  (CRESTOR ) 40 MG tablet Take 1 tablet (40 mg total) by mouth daily. 06/17/24   Floretta Mallard, MD  valsartan  (DIOVAN ) 160 MG tablet Take 1 tablet (160 mg total) by mouth daily. 06/13/24   Floretta Mallard, MD    Family History    Family History  Problem Relation Age of Onset   Congestive Heart Failure Mother    Heart disease Mother    Diabetes Father    Stroke Father    Scoliosis Daughter    She indicated that her mother is deceased. She indicated that her father is  deceased. She indicated that her brother is alive. She indicated that her daughter is alive.  Social History    Social History   Socioeconomic History   Marital status: Single    Spouse name: Not on file   Number of children: Not on file   Years of education: Not on file   Highest education level: Not on file  Occupational History   Not on file  Tobacco Use   Smoking status: Every Day    Current packs/day: 1.00    Average packs/day: 1 pack/day for 40.0 years (40.0 ttl pk-yrs)    Types: Cigarettes   Smokeless tobacco: Never  Vaping Use   Vaping status: Never Used  Substance and Sexual Activity   Alcohol use: Yes    Comment: occ   Drug use: Not Currently   Sexual activity: Not  on file  Other Topics Concern   Not on file  Social History Narrative   Not on file   Social Drivers of Health   Financial Resource Strain: Not on file  Food Insecurity: Low Risk  (06/10/2023)   Received from [facility]   Hunger Vital Sign    Within the past 12 months, you worried that your food would run out before you got money to buy more: Never true    Within the past 12 months, the food you bought just didn't last and you didn't have money to get more. : Never true  Transportation Needs: No Transportation Needs (06/10/2023)   Received from [facility]    In the past 12 months, has lack of reliable transportation kept you from medical appointments, meetings, work or from getting things needed for daily living? : No  Physical Activity: Not on file  Stress: Not on file  Social Connections: Not on file  Intimate Partner Violence: Not on file     Review of Systems    General:  No chills, fever, night sweats or weight changes.  Cardiovascular:  No chest pain, dyspnea on exertion, edema, orthopnea, palpitations, paroxysmal nocturnal dyspnea. Dermatological: No rash, lesions/masses Respiratory: No cough, dyspnea Urologic: No hematuria, dysuria Abdominal:   No nausea,  vomiting, diarrhea, bright red blood per rectum, melena, or hematemesis Neurologic:  No visual changes, wkns, changes in mental status. All other systems reviewed and are otherwise negative except as noted above.  Physical Exam    VS:  BP 116/64   Pulse 77   Ht 5' 6 (1.676 m)   Wt 162 lb (73.5 kg)   SpO2 95%   BMI 26.15 kg/m  , BMI Body mass index is 26.15 kg/m. GEN: Well nourished, well developed, in no acute distress. HEENT: normal. Neck: Supple, no JVD, carotid bruits, or masses. Cardiac: RRR, no murmurs, rubs, or gallops. No clubbing, cyanosis, edema.  Radials/DP/PT 2+ and equal bilaterally.  Respiratory:  Respirations regular and unlabored, clear to auscultation bilaterally. GI: Soft, nontender, nondistended, BS + x 4. MS: no deformity or atrophy. Skin: warm and dry, no rash. Neuro:  Strength and sensation are intact. Psych: Normal affect.  Accessory Clinical Findings    Recent Labs: 11/17/2023: Hemoglobin 14.4; Platelets 300 06/13/2024: BUN 12; Creatinine, Ser 0.91; Potassium 4.1; Sodium 141   Recent Lipid Panel    Component Value Date/Time   CHOL 208 (H) 06/13/2024 1457   TRIG 91 06/13/2024 1457   HDL 66 06/13/2024 1457   CHOLHDL 3.2 06/13/2024 1457   CHOLHDL 3.2 08/13/2015 1701   VLDL 23 08/13/2015 1701   LDLCALC 126 (H) 06/13/2024 1457         ECG personally reviewed by me today-   none today.  Coronary CTA 06/17/2024  FINDINGS: Image quality: Excellent.   Noise artifact is: Limited.   Coronary Arteries:  Normal coronary origin.  Right dominance.   Left main: The left main is a large caliber vessel with a normal take off from the left coronary cusp that bifurcates to form a left anterior descending artery and a left circumflex artery.   Left anterior descending artery: The LAD is patent with moderate 50-69% proximal/mid stenosis and moderate distal stenosis. FFR 0.88 post lesions. Normal. The LAD gives off 2 patent diagonal branches.   Ramus  intermedius: Patent with no evidence of plaque or stenosis.   Left circumflex artery: The LCX is non-dominant and patent with mild mid to distal  stenosis 30-49%. The LCX gives off 2 patent obtuse marginal branches.   Right coronary artery: The RCA is dominant with normal take off from the right coronary cusp. There is mild proximal to mid stenosis 30-49%. Mild scattered calcified plaque. The RCA terminates as a PDA and right posterolateral branch without evidence of plaque or stenosis.   Right Atrium: Right atrial size is within normal limits.   Right Ventricle: The right ventricular cavity is within normal limits.   Left Atrium: Left atrial size is normal in size with no left atrial appendage filling defect.   Left Ventricle: The ventricular cavity size is within normal limits.   Pulmonary arteries: Normal in size.   Pulmonary veins: Normal pulmonary venous drainage.   Pericardium: Normal thickness without significant effusion or calcium  present.   Cardiac valves: The aortic valve is trileaflet without significant calcification. The mitral valve is normal without significant calcification.   Aorta: Normal caliber with aortic atherosclerosis.   Extra-cardiac findings: See attached radiology report for non-cardiac structures.   IMPRESSION: 1. Coronary calcium  score of 442. This was 29 percentile for age-, sex, and race-matched controls.   2. Normal coronary origin with right dominance.   3. Moderate proximal to mid LAD stenosis 50-69%, moderate distal stenosis LAD, mild proximal to mid RCA stenosis 30-49%, mild mid to distal circumflex stenosis 30-49%. FFR normal, no flow limitation.   Electronically Signed: By: Oneil Parchment M.D. On: 06/18/2024 13:49    Assessment & Plan   1.  Coronary artery disease-no chest pain today.  Notes intermittent brief episodes of chest tightness.  Denies exertional chest pain.  Coronary CTA showed coronary calcium  score of 442.  She  was noted to have moderate proximal-mid LAD stenosis 50-69%, moderate distal stenosis of LAD, mild proximal-mid RCA stenosis 30-49%, mild mid-- distal circumflex stenosis 30-49%.  FFR was normal. Continue rosuvastatin , aspirin Heart healthy low-sodium diet Increase physical activity as tolerated  Essential hypertension-BP today 116/64. Maintain blood pressure log Continue valsartan   Hyperlipidemia-LDL 126.  LP(a) 146.1 on last check.  Rosuvastatin  was increased at that time. High-fiber diet Continue rosuvastatin , aspirin Increase physical activity as tolerated Lipids and lfts in December   Tobacco abuse-continues to smoke around half pack per day.  Notes that she has done this for about 30 years.  Has previously tried nicotine  patch, Nicorette gum, and Wellbutrin. strongly encouraged tobacco cessation. Smoking cessation information provided  Disposition: Follow-up with Dr. Floretta or me in 6 months.   Josefa HERO. Jusitn Salsgiver NP-C     07/17/2024, 5:08 PM [facility] [address] [place], [place] [address] Office [phone]    Notice: This dictation was prepared with Dragon dictation along with smaller phrase technology. Any transcriptional errors that result from this process are unintentional and may not be corrected upon review.   I spent 14 minutes examining this patient, reviewing medications, and using patient centered shared decision making involving their cardiac care.   I spent  20 minutes reviewing past medical history,  medications, and prior cardiac tests.

## 2024-07-17 ENCOUNTER — Other Ambulatory Visit: Payer: Self-pay

## 2024-07-17 ENCOUNTER — Ambulatory Visit: Attending: Cardiovascular Disease | Admitting: General Practice

## 2024-07-17 ENCOUNTER — Encounter: Payer: Self-pay | Admitting: General Practice

## 2024-07-17 VITALS — BP 116/64 | HR 77 | Ht 66.0 in | Wt 162.0 lb

## 2024-07-17 DIAGNOSIS — E782 Mixed hyperlipidemia: Secondary | ICD-10-CM

## 2024-07-17 DIAGNOSIS — Z720 Tobacco use: Secondary | ICD-10-CM | POA: Diagnosis not present

## 2024-07-17 DIAGNOSIS — I25118 Atherosclerotic heart disease of native coronary artery with other forms of angina pectoris: Secondary | ICD-10-CM

## 2024-07-17 DIAGNOSIS — I10 Essential (primary) hypertension: Secondary | ICD-10-CM

## 2024-07-17 NOTE — Patient Instructions (Addendum)
 Medication Instructions:  Your physician recommends that you continue on your current medications as directed. Please refer to the Current Medication list given to you today. *If you need a refill on your cardiac medications before your next appointment, please call your pharmacy*  Lab Work: 6 WEEKS FASTING LABS-LIPIDS & LFTS (08/28/24 or week after) If you have labs (blood work) drawn today and your tests are completely normal, you will receive your results only by: MyChart Message (if you have MyChart) OR A paper copy in the mail If you have any lab test that is abnormal or we need to change your treatment, we will call you to review the results.  Testing/Procedures: NONE ORDERED  Follow-Up: At [facility], you and your health needs are our priority.  As part of our continuing mission to provide you with exceptional heart care, our providers are all part of one team.  This team includes your primary Cardiologist (physician) and Advanced Practice Providers or APPs (Physician Assistants and Nurse Practitioners) who all work together to provide you with the care you need, when you need it.  Your next appointment:   6 month(s)  Provider:   Georganna Archer, MD or Josefa Beauvais, NP  We recommend signing up for the patient portal called MyChart.  Sign up information is provided on this After Visit Summary.  MyChart is used to connect with patients for Virtual Visits (Telemedicine).  Patients are able to view lab/test results, encounter notes, upcoming appointments, etc.  Non-urgent messages can be sent to your provider as well.   To learn more about what you can do with MyChart, go to [email or web address].   Other Instructions

## 2024-08-27 ENCOUNTER — Ambulatory Visit: Admitting: Internal Medicine

## 2024-08-27 ENCOUNTER — Encounter: Payer: Self-pay | Admitting: Internal Medicine

## 2024-08-27 ENCOUNTER — Ambulatory Visit

## 2024-08-27 VITALS — BP 122/80 | HR 78 | Ht 66.0 in | Wt 153.0 lb

## 2024-08-27 DIAGNOSIS — F17210 Nicotine dependence, cigarettes, uncomplicated: Secondary | ICD-10-CM

## 2024-08-27 DIAGNOSIS — R911 Solitary pulmonary nodule: Secondary | ICD-10-CM

## 2024-08-27 DIAGNOSIS — J449 Chronic obstructive pulmonary disease, unspecified: Secondary | ICD-10-CM

## 2024-08-27 NOTE — Patient Instructions (Addendum)
 Please remember to go to the  x-ray department  for your tests - we will call you with the results when they are available     The key is to stop smoking completely before smoking completely stops you!    Please schedule a follow up visit in 3 months but call sooner if needed

## 2024-08-27 NOTE — Progress Notes (Unsigned)
 Subjective:     Patient ID: Connie Shaw, female   DOB: 1956/07/24    MRN: [id number]  HPI  89 yowf  Active smoker referred to pulmonary clinic 01/13/2017 by  Ozell Gaskins PA from [facility] with LCS pos several nodules and ? Of COPD  With GOLD II criteria on eval 01/13/2017     01/13/2017 1st [facility] office visit/ Juandiego Kolenovic   Chief Complaint  Patient presents with   Pulmonary Consult    Referred by Ozell Gaskins, PA for eval of abnormal ct chest.   1st week in March 2018 fell and injured R Chest > CxR abn > CT with sev nodules and rec for 6 m per rad criteria  Doe = MMRC1 = can walk nl pace, flat grade, can't hurry or go uphills or steps s sob   Can't really tell proair  helps Rec You have only mild copd and unlikely to progress unless you continue to smoke  Pulmonary follow up is as needed     09/06/2023 Re-establish ov/[place] office/Placida Cambre re: GOLD 2 copd  maint on rx was on lisinopril   with severe cough resolved on ARB Chief Complaint  Patient presents with   Establish Care   COPD   Cough  Dyspnea:  dancing / shopping / housework  Cough: severe intermittent last episode Sept 2024  neg covid rx  abx / prednisone   09/04/23 woke up with ST / severe cough > thick clear > no sick contacts  Assoc nasal congestion and more sob than usual  Fever one day prior to OV  but no  fever on day of ov and st  Able to lie flat one pillow  Rec Try prilosec otc 20mg   Take 30-60 min before first meal of the day and Pepcid ac (famotidine) 20 mg one  after until cough and throat clearing are completely gone for at least a week without the need for cough suppression Zpak  Pulmonary follow up is as needed as long as back to 100% to where you were before the onset     I personally reviewed images and agree with radiology impression as follows:   Chest LDSCT     10/06/23 1. Lung-RADS 0, incomplete. Additional lung cancer screening CT images/or comparison to prior chest CT examinations  is needed. Development of innumerable pulmonary nodules and areas of nodular consolidation, favored to be infectious/inflammatory. Consider antibiotic therapy and lung cancer screening CT follow-up at 6-12 weeks. 2. Aortic atherosclerosis (ICD10-I70.0), coronary artery atherosclerosis and emphysema (ICD10-J43.9).  Rx doxy x 7 days 10/19/23   11/17/2023  f/u ov/[place] office/Tanesha Arambula re: abn CT  maint on nothing  / still smoking/ does use otc gerd rx for flares of cough and seems to help  Chief Complaint  Patient presents with   COPD   Dyspnea:  still dancing does fine unless sinuses act up then starts cough last bad  episode clinically at Tgiving  Cough:  no am cough  Sleeping: flat bed one pillow s   resp cc  SABA use: not using it  02: none Lung cancer screening: 10/06/23  New night sweats x 3 weeks rec The key is to stop smoking completely before smoking completely stops you! My office will be contacting you by phone for referral to [facility]  > not done as of 01/25/2024  > declined  For cough / congestion > mucinex  dm or Mucinex  D (behind counter/have to sign) as needed  For wheeze > albuterol  2 puffs  evey 4 hours as needed  - 11/17/2023  alpha one AT phenotype >>>  MM  level 158  / IgE 15  Eos 0.2  - cxr ok   We will cancel the CT for March and see you in 6 weeks -bring inhaler with you    01/25/2024  f/u ov/[place] office/Fitzgerald Dunne re: GOLD 2 copd  maint on saba prn  did  bring inhaler but very poor hfa technique still smoking  Chief Complaint  Patient presents with   COPD  Dyspnea:  still dancing rarely / flat surface fast   Cough: none  Sleeping: flat bed one pillow  resp cc  SABA use: rarely use it  02: none  Lung cancer screening: due now   Rec The key is to stop smoking completely before smoking completely stops you! Only use your albuterol  as a rescue medication   Also  Ok to try albuterol  15 min before an activity (on alternating days)  that you know would usually  make you short of breath   Work on inhaler technique:    03/19/2024  f/u ov/[place] office/Aleisha Paone re: GOLD 2 copd /cough since sept 2024  maint on alb prn  and omeprazole  40 mg but not ac Chief Complaint  Patient presents with   Follow-up    Results from ct - thinks her meds are making her sick and cough  Dyspnea:  still dancing but coughing limiting problem  Cough: mild smoker's rattle much worse in am on awakening > gobs of clear mucus Sleeping: flat bed one pillow and no noct   resp cc  SABA use: bid seems to help 02: none  Rec Omeprazole  40 mg Take 30- 60 min before your first and last meals of the day  GERD diet reviewed, bed blocks rec  For cough/ congestion > mucinex  dm  up to maximum of  1200 mg every 12 hours as needed  Symbicort  80 Take 2 puffs first thing in am and then another 2 puffs about 12 hours later.  Work on inhaler technique:   >>>  Remember how golfers warm up by taking practice swings - do this with an empty inhaler  Zpak Prednisone  10 mg take  4 each am x 2 days,   2 each am x 2 days,  1 each am x 2 days and stop     08/22/24 acute  R antolateral cp / fever gone, pain is better/mucus brown  clear now     08/27/2024  f/u ov/Lorna Strother re: GOLD 2 copd   maint on omeprazole   still smoker  Chief Complaint  Patient presents with   Medical Management of Chronic Issues   COPD    Recent PNA- admitted at [facility] 08/22/24. She states her breathing is back to baseline.   Dyspnea:  limited by R knee  Cough: none  Sleeping: bed is flat  resp cc  SABA use: none  02: none     No obvious day to day or daytime variability or assoc excess/ purulent sputum or mucus plugs or hemoptysis or cp or chest tightness, subjective wheeze or overt sinus or hb symptoms.    Also denies any obvious fluctuation of symptoms with weather or environmental changes or other aggravating or alleviating factors except as outlined above   No unusual exposure hx or h/o childhood pna/ asthma or  knowledge of premature birth.  Current Allergies, Complete Past Medical History, Past Surgical History, Family History, and Social History were reviewed in [employer]  medical record.  ROS  The following are not active complaints unless bolded Hoarseness, sore throat, dysphagia, dental problems, itching, sneezing,  nasal congestion or discharge of excess mucus or purulent secretions, ear ache,   fever, chills, sweats, unintended wt loss or wt gain, classically pleuritic or exertional cp,  orthopnea pnd or arm/hand swelling  or leg swelling, presyncope, palpitations, abdominal pain, anorexia, nausea, vomiting, diarrhea  or change in bowel habits or change in bladder habits, change in stools or change in urine, dysuria, hematuria,  rash, arthralgias, visual complaints, headache, numbness, weakness or ataxia or problems with walking or coordination,  change in mood or  memory.        Current Meds  Medication Sig   ALPRAZolam (XANAX) 1 MG tablet Take 1 mg by mouth daily as needed for anxiety.   ASPIRIN LOW DOSE 81 MG tablet Take 81 mg by mouth daily.   Blood Pressure Monitor MISC Check blood pressure twice daily   oxymetazoline  (AFRIN) 0.05 % nasal spray Place 1 spray into both nostrils 2 (two) times daily.   rosuvastatin  (CRESTOR ) 40 MG tablet Take 1 tablet (40 mg total) by mouth daily.   valsartan  (DIOVAN ) 160 MG tablet Take 1 tablet (160 mg total) by mouth daily.             Objective:   Physical Exam   Wt 08/27/2024        153 03/19/2024        161  01/25/2024        159  11/17/2023        156  09/06/2023        161    01/13/17 195 lb 6.4 oz (88.6 kg)  12/26/16 190 lb 12.8 oz (86.5 kg)  12/13/16 191 lb (86.6 kg)    Vital signs reviewed  08/27/2024  - Note at rest 02 sats  99% on RA   General appearance:    amb wf nad    HEENT : Oropharynx  clear      NECK :  without  apparent JVD/ palpable Nodes/TM    LUNGS: no acc muscle use,  Min barrel  contour chest wall  with bilateral  slightly decreased bs s audible wheeze and  without cough on insp or exp maneuvers and min  Hyperresonant  to  percussion bilaterally    CV:  RRR  no s3 or murmur or increase in P2, and no edema   ABD:  soft and nontender    MS:  Nl gait/ ext warm without deformities Or obvious joint restrictions  calf tenderness, cyanosis or clubbing     SKIN: warm and dry without lesions    NEURO:  alert, approp, nl sensorium with  no motor or cerebellar deficits apparent.          Assessment:

## 2024-08-28 NOTE — Assessment & Plan Note (Addendum)
 Active smoker/MM - Spirometry 01/13/2017  FEV1 1.84 (68%)  Ratio 64 p no rx prior   - 11/17/2023  alpha one AT phenotype >>>  MM  level 158  / IgE 15  Eos 0.2  - 01/25/2024  After extensive coaching inhaler device,  effectiveness =    50% (consistently late trigger)  - 03/19/2024  After extensive coaching inhaler device,  effectiveness =    60% hfa > start symbicort  80 since her main c/o is cough   Not using any inhalers at present c/w AB phenotype in pt with no limiting doe  ( since limited first by R Knee pain) and most recent AECOPD was actually some form of cap so  >>>  ok to me to use symbicort  80 up to 2 bid prn   as is done in mild asthma based on two studies from NEJM  378; 20 p 1865 (2018) and 380 : p2020-30 (2019) in pts with mild asthma it is reasonable to use low dose symbicort  eg 80 2bid prn flare in this setting but I emphasized this was only shown with symbicort  and takes advantage of the rapid onset of action but is not the same as rescue therapy but can be stopped once the acute symptoms have resolved and the need for rescue has been minimized (< 2 x weekly)

## 2024-08-28 NOTE — Assessment & Plan Note (Addendum)
 Low Dose screen 01/05/17 Lung-Rads category 3-S, probably benign findings. Chest LDSCT     10/06/23 1. Lung-RADS 0, incomplete. Additional lung cancer screening CT images/or comparison to prior chest CT examinations is needed. Development of innumerable pulmonary nodules and areas of nodular consolidation, favored to be infectious/inflammatory. Consider antibiotic therapy and lung cancer screening CT follow-up at 6-12 weeks. - 11/17/2023 labs:  Quant TB neg , Eos 0.2,   IgE 15,  ESR 3 - CT lung cuts on coronary study 06/17/24: There is no pleural effusion. There is mild to moderate severity emphysematous lung disease. Stable right middle lobe scarring and/or atelectasis is seen. Very mild posterolateral right lower lobe atelectasis and/or infiltrate   Appears she did have RML/ RLL pna  but now back to baseline symptomatically and almost completely cleared radiographically  so ok to resume LDSCT per LCS program.      Each maintenance medication was reviewed in detail including emphasizing most importantly the difference between maintenance and prns and under what circumstances the prns are to be triggered using an action plan format where appropriate.  Total time for H and P, chart review, counseling, reviewing hfa  device(s) and generating customized AVS unique to this office visit / same day charting = 20 min

## 2024-08-28 NOTE — Assessment & Plan Note (Addendum)
 4  min discussion re active cigarette smoking in addition to office E&M  Ask about tobacco use:   ongoig  Advise quitting   I took an extended  opportunity with this patient to outline the consequences of continued cigarette use  in airway disorders based on all the data we have from the multiple national lung health studies (perfomed over decades at millions of dollars in cost)  indicating that smoking cessation, not choice of inhalers or pulmonary physicians, is the most important aspect of her  care.   Assess willingness:  Not committed at this point Assist in quit attempt:  Per PCP when ready Arrange follow up:   Follow up per Primary Care planned

## 2024-09-02 ENCOUNTER — Ambulatory Visit: Payer: Self-pay | Admitting: Internal Medicine

## 2024-09-02 NOTE — Progress Notes (Signed)
 Call pt:  Reviewed cxr and much better than prior studies   - radiology recs one final cxr in 3 m so plan on ov then with cxr same day to close the loop

## 2024-09-25 ENCOUNTER — Other Ambulatory Visit (HOSPITAL_COMMUNITY): Payer: Self-pay

## 2024-09-27 LAB — LIPID PANEL
Chol/HDL Ratio: 2 ratio (ref 0.0–4.4)
Cholesterol, Total: 151 mg/dL (ref 100–199)
HDL: 74 mg/dL
LDL Chol Calc (NIH): 61 mg/dL (ref 0–99)
Triglycerides: 87 mg/dL (ref 0–149)
VLDL Cholesterol Cal: 16 mg/dL (ref 5–40)

## 2024-09-27 LAB — HEPATIC FUNCTION PANEL
ALT: 12 IU/L (ref 0–32)
AST: 17 IU/L (ref 0–40)
Albumin: 4.2 g/dL (ref 3.9–4.9)
Alkaline Phosphatase: 66 IU/L (ref 49–135)
Bilirubin Total: 0.4 mg/dL (ref 0.0–1.2)
Bilirubin, Direct: 0.13 mg/dL (ref 0.00–0.40)
Total Protein: 6.6 g/dL (ref 6.0–8.5)

## 2024-09-30 ENCOUNTER — Ambulatory Visit: Payer: Self-pay | Admitting: General Practice

## 2024-11-27 ENCOUNTER — Ambulatory Visit: Admitting: Internal Medicine

## 2024-12-10 ENCOUNTER — Ambulatory Visit: Admitting: Internal Medicine

## 2024-12-10 ENCOUNTER — Ambulatory Visit: Admitting: Student in an Organized Health Care Education/Training Program
